# Patient Record
Sex: Female | Born: 1950 | Race: White | Hispanic: No | Marital: Married | Smoking: Former smoker
Health system: Southern US, Community
[De-identification: ages and names within clinical notes are randomized; demographics above are authoritative.]

## PROBLEM LIST (undated history)

## (undated) DIAGNOSIS — C50919 Malignant neoplasm of unspecified site of unspecified female breast: Secondary | ICD-10-CM

## (undated) DIAGNOSIS — Z853 Personal history of malignant neoplasm of breast: Secondary | ICD-10-CM

## (undated) DIAGNOSIS — K760 Fatty (change of) liver, not elsewhere classified: Secondary | ICD-10-CM

## (undated) DIAGNOSIS — Z923 Personal history of irradiation: Secondary | ICD-10-CM

## (undated) DIAGNOSIS — C50419 Malignant neoplasm of upper-outer quadrant of unspecified female breast: Secondary | ICD-10-CM

## (undated) DIAGNOSIS — C50911 Malignant neoplasm of unspecified site of right female breast: Secondary | ICD-10-CM

## (undated) DIAGNOSIS — C801 Malignant (primary) neoplasm, unspecified: Secondary | ICD-10-CM

## (undated) DIAGNOSIS — R920 Mammographic microcalcification found on diagnostic imaging of breast: Secondary | ICD-10-CM

## (undated) DIAGNOSIS — I700 Atherosclerosis of aorta: Secondary | ICD-10-CM

## (undated) DIAGNOSIS — Z9221 Personal history of antineoplastic chemotherapy: Secondary | ICD-10-CM

## (undated) DIAGNOSIS — Z171 Estrogen receptor negative status [ER-]: Secondary | ICD-10-CM

## (undated) DIAGNOSIS — Z87891 Personal history of nicotine dependence: Secondary | ICD-10-CM

## (undated) DIAGNOSIS — B029 Zoster without complications: Secondary | ICD-10-CM

## (undated) DIAGNOSIS — Z1211 Encounter for screening for malignant neoplasm of colon: Secondary | ICD-10-CM

## (undated) DIAGNOSIS — G039 Meningitis, unspecified: Secondary | ICD-10-CM

## (undated) HISTORY — DX: Personal history of nicotine dependence: Z87.891

## (undated) HISTORY — PX: COLONOSCOPY: SHX174

## (undated) HISTORY — DX: Encounter for screening for malignant neoplasm of colon: Z12.11

## (undated) HISTORY — PX: TUBAL LIGATION: SHX77

## (undated) HISTORY — DX: Meningitis, unspecified: G03.9

## (undated) HISTORY — DX: Malignant (primary) neoplasm, unspecified: C80.1

## (undated) HISTORY — DX: Atherosclerosis of aorta: I70.0

## (undated) HISTORY — DX: Malignant neoplasm of upper-outer quadrant of unspecified female breast: C50.419

## (undated) HISTORY — DX: Estrogen receptor negative status (ER-): Z17.1

## (undated) HISTORY — DX: Malignant neoplasm of unspecified site of right female breast: C50.911

## (undated) HISTORY — DX: Zoster without complications: B02.9

## (undated) HISTORY — DX: Mammographic microcalcification found on diagnostic imaging of breast: R92.0

## (undated) HISTORY — DX: Fatty (change of) liver, not elsewhere classified: K76.0

## (undated) HISTORY — DX: Personal history of malignant neoplasm of breast: Z85.3

---
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
## 1993-03-06 HISTORY — PX: ABDOMINAL HYSTERECTOMY: SHX81

## 2004-04-21 ENCOUNTER — Ambulatory Visit: Payer: Self-pay | Admitting: Family Medicine

## 2004-06-11 ENCOUNTER — Emergency Department: Payer: Self-pay | Admitting: Emergency Medicine

## 2005-05-11 ENCOUNTER — Ambulatory Visit: Payer: Self-pay | Admitting: Family Medicine

## 2006-05-15 ENCOUNTER — Ambulatory Visit: Payer: Self-pay | Admitting: Family Medicine

## 2007-09-03 ENCOUNTER — Ambulatory Visit: Payer: Self-pay | Admitting: Family Medicine

## 2007-10-18 ENCOUNTER — Encounter: Payer: Self-pay | Admitting: Pulmonary Disease

## 2007-10-18 ENCOUNTER — Ambulatory Visit: Payer: Self-pay | Admitting: Family Medicine

## 2007-10-31 ENCOUNTER — Ambulatory Visit: Payer: Self-pay | Admitting: Family Medicine

## 2007-10-31 ENCOUNTER — Encounter: Payer: Self-pay | Admitting: Pulmonary Disease

## 2007-11-08 ENCOUNTER — Encounter: Payer: Self-pay | Admitting: Pulmonary Disease

## 2007-11-08 ENCOUNTER — Ambulatory Visit: Payer: Self-pay | Admitting: Family Medicine

## 2008-01-15 ENCOUNTER — Ambulatory Visit: Payer: Self-pay | Admitting: Family Medicine

## 2008-01-15 ENCOUNTER — Encounter: Payer: Self-pay | Admitting: Pulmonary Disease

## 2008-02-10 ENCOUNTER — Ambulatory Visit: Payer: Self-pay | Admitting: Pulmonary Disease

## 2008-02-10 DIAGNOSIS — J309 Allergic rhinitis, unspecified: Secondary | ICD-10-CM

## 2008-02-10 DIAGNOSIS — E785 Hyperlipidemia, unspecified: Secondary | ICD-10-CM | POA: Insufficient documentation

## 2008-02-10 DIAGNOSIS — R930 Abnormal findings on diagnostic imaging of skull and head, not elsewhere classified: Secondary | ICD-10-CM | POA: Insufficient documentation

## 2008-02-11 ENCOUNTER — Telehealth (INDEPENDENT_AMBULATORY_CARE_PROVIDER_SITE_OTHER): Payer: Self-pay | Admitting: *Deleted

## 2008-02-12 ENCOUNTER — Ambulatory Visit: Payer: Self-pay | Admitting: Pulmonary Disease

## 2008-02-12 DIAGNOSIS — J479 Bronchiectasis, uncomplicated: Secondary | ICD-10-CM

## 2008-02-12 DIAGNOSIS — J984 Other disorders of lung: Secondary | ICD-10-CM

## 2008-03-06 DIAGNOSIS — C801 Malignant (primary) neoplasm, unspecified: Secondary | ICD-10-CM

## 2008-03-06 DIAGNOSIS — C50919 Malignant neoplasm of unspecified site of unspecified female breast: Secondary | ICD-10-CM

## 2008-03-06 DIAGNOSIS — Z9221 Personal history of antineoplastic chemotherapy: Secondary | ICD-10-CM

## 2008-03-06 DIAGNOSIS — Z923 Personal history of irradiation: Secondary | ICD-10-CM

## 2008-03-06 HISTORY — PX: BREAST EXCISIONAL BIOPSY: SUR124

## 2008-03-06 HISTORY — DX: Personal history of antineoplastic chemotherapy: Z92.21

## 2008-03-06 HISTORY — DX: Malignant neoplasm of unspecified site of unspecified female breast: C50.919

## 2008-03-06 HISTORY — DX: Malignant (primary) neoplasm, unspecified: C80.1

## 2008-03-06 HISTORY — DX: Personal history of irradiation: Z92.3

## 2008-03-06 HISTORY — PX: BREAST LUMPECTOMY: SHX2

## 2008-03-06 HISTORY — PX: PORTACATH PLACEMENT: SHX2246

## 2009-01-12 ENCOUNTER — Ambulatory Visit: Payer: Self-pay | Admitting: Family Medicine

## 2009-01-14 ENCOUNTER — Ambulatory Visit: Payer: Self-pay | Admitting: Physician Assistant

## 2009-01-29 ENCOUNTER — Ambulatory Visit: Payer: Self-pay | Admitting: General Surgery

## 2009-02-02 ENCOUNTER — Ambulatory Visit: Payer: Self-pay | Admitting: General Surgery

## 2009-02-03 ENCOUNTER — Ambulatory Visit: Payer: Self-pay | Admitting: Oncology

## 2009-02-19 ENCOUNTER — Ambulatory Visit: Payer: Self-pay | Admitting: Oncology

## 2009-03-03 ENCOUNTER — Ambulatory Visit: Payer: Self-pay | Admitting: General Surgery

## 2009-03-06 ENCOUNTER — Ambulatory Visit: Payer: Self-pay | Admitting: Oncology

## 2009-04-06 ENCOUNTER — Ambulatory Visit: Payer: Self-pay | Admitting: Oncology

## 2009-05-04 ENCOUNTER — Ambulatory Visit: Payer: Self-pay | Admitting: Oncology

## 2009-06-04 ENCOUNTER — Ambulatory Visit: Payer: Self-pay | Admitting: Oncology

## 2009-07-04 ENCOUNTER — Ambulatory Visit: Payer: Self-pay | Admitting: Oncology

## 2009-08-04 ENCOUNTER — Ambulatory Visit: Payer: Self-pay | Admitting: Oncology

## 2009-09-03 ENCOUNTER — Ambulatory Visit: Payer: Self-pay | Admitting: Oncology

## 2009-10-04 ENCOUNTER — Ambulatory Visit: Payer: Self-pay | Admitting: Oncology

## 2009-10-29 LAB — CANCER ANTIGEN 27.29: CA 27.29: 11 U/mL (ref 0.0–38.6)

## 2009-11-04 ENCOUNTER — Ambulatory Visit: Payer: Self-pay | Admitting: Oncology

## 2009-12-09 ENCOUNTER — Ambulatory Visit: Payer: Self-pay | Admitting: Oncology

## 2010-01-04 ENCOUNTER — Ambulatory Visit: Payer: Self-pay | Admitting: Oncology

## 2010-01-17 ENCOUNTER — Ambulatory Visit: Payer: Self-pay | Admitting: General Surgery

## 2010-02-03 ENCOUNTER — Ambulatory Visit: Payer: Self-pay | Admitting: Oncology

## 2010-02-21 ENCOUNTER — Ambulatory Visit: Payer: Self-pay | Admitting: Unknown Physician Specialty

## 2010-03-14 ENCOUNTER — Ambulatory Visit: Payer: Self-pay | Admitting: Oncology

## 2010-03-15 LAB — CANCER ANTIGEN 27.29: CA 27.29: 19.2 U/mL (ref 0.0–38.6)

## 2010-04-06 ENCOUNTER — Ambulatory Visit: Payer: Self-pay | Admitting: Oncology

## 2010-04-06 HISTORY — PX: PORT-A-CATH REMOVAL: SHX5289

## 2010-05-05 ENCOUNTER — Ambulatory Visit: Payer: Self-pay | Admitting: Oncology

## 2010-08-23 ENCOUNTER — Ambulatory Visit: Payer: Self-pay | Admitting: General Surgery

## 2010-09-22 ENCOUNTER — Ambulatory Visit: Payer: Self-pay | Admitting: Oncology

## 2010-10-05 ENCOUNTER — Ambulatory Visit: Payer: Self-pay | Admitting: Oncology

## 2010-11-05 ENCOUNTER — Ambulatory Visit: Payer: Self-pay | Admitting: Oncology

## 2011-02-23 ENCOUNTER — Ambulatory Visit: Payer: Self-pay | Admitting: General Surgery

## 2011-03-07 DIAGNOSIS — R920 Mammographic microcalcification found on diagnostic imaging of breast: Secondary | ICD-10-CM

## 2011-03-07 HISTORY — DX: Mammographic microcalcification found on diagnostic imaging of breast: R92.0

## 2011-03-27 ENCOUNTER — Ambulatory Visit: Payer: Self-pay | Admitting: Oncology

## 2011-03-27 LAB — COMPREHENSIVE METABOLIC PANEL
Alkaline Phosphatase: 103 U/L (ref 50–136)
Anion Gap: 9 (ref 7–16)
Bilirubin,Total: 0.4 mg/dL (ref 0.2–1.0)
Calcium, Total: 9.4 mg/dL (ref 8.5–10.1)
Chloride: 101 mmol/L (ref 98–107)
Co2: 30 mmol/L (ref 21–32)
EGFR (African American): >60
EGFR (Non-African Amer.): >60
Glucose: 100 mg/dL — ABNORMAL HIGH (ref 65–99)
Potassium: 4.1 mmol/L (ref 3.5–5.1)
SGOT(AST): 29 U/L (ref 15–37)
SGPT (ALT): 28 U/L

## 2011-03-27 LAB — CBC CANCER CENTER
Basophil #: 0 x10 3/mm (ref 0.0–0.1)
Basophil %: 0.4 %
Eosinophil #: 0.1 x10 3/mm (ref 0.0–0.7)
HGB: 14.9 g/dL (ref 12.0–16.0)
Lymphocyte %: 25.2 %
MCH: 31.6 pg (ref 26.0–34.0)
MCHC: 34.1 g/dL (ref 32.0–36.0)
MCV: 93 fL (ref 80–100)
Monocyte #: 0.4 x10 3/mm (ref 0.0–0.7)
Neutrophil #: 2.7 x10 3/mm (ref 1.4–6.5)
Neutrophil %: 63.2 %
Platelet: 239 x10 3/mm (ref 150–440)
RBC: 4.71 10*6/uL (ref 3.80–5.20)
RDW: 13.6 % (ref 11.5–14.5)

## 2011-03-27 LAB — LIPID PANEL
Cholesterol: 272 mg/dL — ABNORMAL HIGH (ref 0–200)
HDL Cholesterol: 80 mg/dL — ABNORMAL HIGH (ref 40–60)

## 2011-03-28 LAB — CANCER ANTIGEN 27.29: CA 27.29: 23.4 U/mL (ref 0.0–38.6)

## 2011-04-07 ENCOUNTER — Ambulatory Visit: Payer: Self-pay | Admitting: Oncology

## 2011-09-28 ENCOUNTER — Ambulatory Visit: Payer: Self-pay | Admitting: Oncology

## 2011-09-28 LAB — CBC CANCER CENTER
Basophil %: 0.6 %
Eosinophil #: 0.1 x10 3/mm (ref 0.0–0.7)
Eosinophil %: 1.9 %
HGB: 15.3 g/dL (ref 12.0–16.0)
MCH: 31.8 pg (ref 26.0–34.0)
MCHC: 33.6 g/dL (ref 32.0–36.0)
MCV: 95 fL (ref 80–100)
Monocyte #: 0.4 x10 3/mm (ref 0.2–0.9)
Neutrophil #: 2.1 x10 3/mm (ref 1.4–6.5)
Neutrophil %: 54.1 %
Platelet: 254 x10 3/mm (ref 150–440)
RBC: 4.8 10*6/uL (ref 3.80–5.20)
RDW: 13.2 % (ref 11.5–14.5)
WBC: 3.9 x10 3/mm (ref 3.6–11.0)

## 2011-09-28 LAB — LIPID PANEL
Cholesterol: 281 mg/dL — ABNORMAL HIGH (ref 0–200)
HDL Cholesterol: 81 mg/dL — ABNORMAL HIGH (ref 40–60)
Ldl Cholesterol, Calc: 170 mg/dL — ABNORMAL HIGH (ref 0–100)
Triglycerides: 149 mg/dL (ref 0–200)
VLDL Cholesterol, Calc: 30 mg/dL (ref 5–40)

## 2011-09-28 LAB — TSH: Thyroid Stimulating Horm: 6.53 u[IU]/mL — ABNORMAL HIGH

## 2011-09-28 LAB — COMPREHENSIVE METABOLIC PANEL
Albumin: 4.1 g/dL (ref 3.4–5.0)
Alkaline Phosphatase: 83 U/L (ref 50–136)
BUN: 13 mg/dL (ref 7–18)
Bilirubin,Total: 0.4 mg/dL (ref 0.2–1.0)
Co2: 29 mmol/L (ref 21–32)
Creatinine: 0.87 mg/dL (ref 0.60–1.30)
EGFR (Non-African Amer.): >60
Glucose: 102 mg/dL — ABNORMAL HIGH (ref 65–99)
SGOT(AST): 22 U/L (ref 15–37)
SGPT (ALT): 31 U/L

## 2011-09-29 LAB — CANCER ANTIGEN 27.29: CA 27.29: 15.7 U/mL (ref 0.0–38.6)

## 2011-10-05 ENCOUNTER — Ambulatory Visit: Payer: Self-pay | Admitting: Oncology

## 2012-02-26 ENCOUNTER — Ambulatory Visit: Payer: Self-pay | Admitting: General Surgery

## 2012-02-29 ENCOUNTER — Ambulatory Visit: Payer: Self-pay | Admitting: General Surgery

## 2012-03-06 ENCOUNTER — Ambulatory Visit: Payer: Self-pay | Admitting: Oncology

## 2012-03-06 DIAGNOSIS — B029 Zoster without complications: Secondary | ICD-10-CM

## 2012-03-06 HISTORY — PX: BREAST BIOPSY: SHX20

## 2012-03-06 HISTORY — DX: Zoster without complications: B02.9

## 2012-03-11 ENCOUNTER — Ambulatory Visit: Payer: Self-pay | Admitting: General Surgery

## 2012-03-28 LAB — COMPREHENSIVE METABOLIC PANEL
Albumin: 4 g/dL (ref 3.4–5.0)
Alkaline Phosphatase: 86 U/L (ref 50–136)
Anion Gap: 8 (ref 7–16)
BUN: 14 mg/dL (ref 7–18)
Bilirubin,Total: 0.4 mg/dL (ref 0.2–1.0)
Co2: 30 mmol/L (ref 21–32)
Creatinine: 0.81 mg/dL (ref 0.60–1.30)
EGFR (African American): >60
EGFR (Non-African Amer.): >60
SGOT(AST): 25 U/L (ref 15–37)
Sodium: 137 mmol/L (ref 136–145)

## 2012-03-28 LAB — CBC CANCER CENTER
Basophil #: 0 x10 3/mm (ref 0.0–0.1)
HCT: 46.1 % (ref 35.0–47.0)
Lymphocyte #: 1.2 x10 3/mm (ref 1.0–3.6)
Lymphocyte %: 22.1 %
MCHC: 33.8 g/dL (ref 32.0–36.0)
MCV: 92 fL (ref 80–100)
Monocyte #: 0.4 x10 3/mm (ref 0.2–0.9)
Neutrophil #: 3.6 x10 3/mm (ref 1.4–6.5)
Neutrophil %: 67.9 %
Platelet: 214 x10 3/mm (ref 150–440)
RBC: 5.01 10*6/uL (ref 3.80–5.20)
WBC: 5.2 x10 3/mm (ref 3.6–11.0)

## 2012-03-29 LAB — CANCER ANTIGEN 27.29: CA 27.29: 22.5 U/mL (ref 0.0–38.6)

## 2012-04-06 ENCOUNTER — Ambulatory Visit: Payer: Self-pay | Admitting: Oncology

## 2012-05-04 ENCOUNTER — Ambulatory Visit: Payer: Self-pay | Admitting: Oncology

## 2012-08-04 ENCOUNTER — Ambulatory Visit: Payer: Self-pay | Admitting: Internal Medicine

## 2012-09-02 ENCOUNTER — Encounter: Payer: Self-pay | Admitting: *Deleted

## 2012-09-02 DIAGNOSIS — C801 Malignant (primary) neoplasm, unspecified: Secondary | ICD-10-CM | POA: Insufficient documentation

## 2012-09-09 ENCOUNTER — Other Ambulatory Visit: Payer: Self-pay

## 2012-09-09 ENCOUNTER — Encounter: Payer: Self-pay | Admitting: General Surgery

## 2012-09-09 ENCOUNTER — Ambulatory Visit (INDEPENDENT_AMBULATORY_CARE_PROVIDER_SITE_OTHER): Payer: Managed Care, Other (non HMO) | Admitting: General Surgery

## 2012-09-09 VITALS — BP 130/72 | HR 74 | Resp 12 | Ht 64.0 in | Wt 153.0 lb

## 2012-09-09 DIAGNOSIS — Z853 Personal history of malignant neoplasm of breast: Secondary | ICD-10-CM

## 2012-09-09 DIAGNOSIS — N644 Mastodynia: Secondary | ICD-10-CM

## 2012-09-09 NOTE — Progress Notes (Signed)
Patient ID: Anna Randolph, female   DOB: Dec 16, 1950, 62 y.o.   MRN: [id number]  Chief Complaint  Patient presents with  . Breast Problem    right breast pain for several weeks - 7 month post op stereo biopsy    HPI Anna Randolph is a 62 y.o. female who presents for right breast pain for several weeks. The patient is 7 months post op right breast stereo biopsy. She also states the right breast heaviness and thickening for approximately 4 weeks. The pain is in several different places and not confine to one location.   HPI  Past Medical History  Diagnosis Date  . Personal history of tobacco use, presenting hazards to health   . Personal history of malignant neoplasm of breast   . Mammographic microcalcification 2013  . Special screening for malignant neoplasms, colon   . 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 Malignant neoplasm of upper-outer quadrant of female breast   . Meningitis as infant  . Shingles 2014    Past Surgical History  Procedure Laterality Date  . Colonoscopy  4098,1191    Dr. Mechele Collin  . Portacath placement  2010    placement  . Breast lumpectomy Right 2010    sentinel node bx  . Abdominal hysterectomy  1995  . Tonsillectomy  1960  . Tubal ligation    . Port-a-cath removal  04/2010  . Breast biopsy Right Jan 2014    stereo    Family History  Problem Relation Age of Onset  . Leukemia Brother     Social History History  Substance Use Topics  . Smoking status: Former Smoker -- 18 years  . Smokeless tobacco: Not on file     Comment: quit 1990  . Alcohol Use: 0.0 oz/week    5-6 Glasses of wine per week    Allergies  Allergen Reactions  . Sulfa Antibiotics Anaphylaxis    Throat Swells  . Sulfonamide Derivatives Anaphylaxis    Throat Swells  . Tape Rash    Adhesive bandages    Current Outpatient Prescriptions  Medication Sig Dispense Refill  . aspirin 81 MG tablet Take 81 mg by mouth daily.      .  calcium-vitamin D (OSCAL WITH D) 500-200 MG-UNIT per tablet Take 1 tablet by mouth.      . Coenzyme Q10 (CO Q 10) 10 MG CAPS Take 1 tablet by mouth daily.      Marland Kitchen lactobacillus acidophilus (BACID) TABS Take 2 tablets by mouth 3 (three) times daily.      . Misc Natural Products (DAILY HERBS ENERGY PO) Take 1 capsule by mouth.      Gerarda Fraction Root EXTR Take 1 tablet by mouth daily.       No current facility-administered medications for this visit.    Review of Systems Review of Systems  Constitutional: Negative.   Respiratory: Negative.   Cardiovascular: Negative.     Blood pressure 130/72, pulse 74, resp. rate 12, height 5\' 4"  (1.626 m), weight 153 lb (69.4 kg).  Physical Exam Physical Exam  Constitutional: She is oriented to person, place, and time. She appears well-developed and well-nourished.  Pulmonary/Chest:  Right breast will ill define firm area 12 o'clock above the nipple. This is the site of her tenderness. No palpable axillary nodes.  Neurological: She is alert and oriented to person, place, and time.  Skin: Skin is warm and dry.    Data Reviewed None  Assessment  Probable benign changes of radiation. US done today showed no findings in the area of thickening.    Plan    Patient to call if no better in 1 month.         SANKAR,SEEPLAPUTHUR G 09/10/2012, 7:11 AM

## 2012-09-09 NOTE — Patient Instructions (Addendum)
Patient to call if no better in 1 month. Otherwise follow up as scheduled.

## 2012-09-10 ENCOUNTER — Encounter: Payer: Self-pay | Admitting: General Surgery

## 2012-09-24 ENCOUNTER — Ambulatory Visit: Payer: Self-pay | Admitting: Oncology

## 2012-09-26 LAB — COMPREHENSIVE METABOLIC PANEL
Alkaline Phosphatase: 104 U/L (ref 50–136)
Co2: 30 mmol/L (ref 21–32)
EGFR (African American): >60
EGFR (Non-African Amer.): >60
Osmolality: 277 (ref 275–301)
Potassium: 4.3 mmol/L (ref 3.5–5.1)
Sodium: 139 mmol/L (ref 136–145)
Total Protein: 7.7 g/dL (ref 6.4–8.2)

## 2012-09-26 LAB — CBC CANCER CENTER
HGB: 15.4 g/dL (ref 12.0–16.0)
Lymphocyte #: 1.1 x10 3/mm (ref 1.0–3.6)
Monocyte %: 9.8 %
WBC: 4.7 x10 3/mm (ref 3.6–11.0)

## 2012-09-27 LAB — CANCER ANTIGEN 27.29: CA 27.29: 15.9 U/mL (ref 0.0–38.6)

## 2012-10-04 ENCOUNTER — Ambulatory Visit: Payer: Self-pay | Admitting: Oncology

## 2012-12-10 ENCOUNTER — Ambulatory Visit (INDEPENDENT_AMBULATORY_CARE_PROVIDER_SITE_OTHER): Payer: Managed Care, Other (non HMO) | Admitting: Podiatry

## 2012-12-10 ENCOUNTER — Ambulatory Visit (INDEPENDENT_AMBULATORY_CARE_PROVIDER_SITE_OTHER): Payer: Managed Care, Other (non HMO)

## 2012-12-10 ENCOUNTER — Encounter: Payer: Self-pay | Admitting: Podiatry

## 2012-12-10 VITALS — BP 131/85 | HR 60 | Temp 98.0°F | Resp 16 | Ht 64.0 in | Wt 156.2 lb

## 2012-12-10 DIAGNOSIS — R52 Pain, unspecified: Secondary | ICD-10-CM

## 2012-12-10 DIAGNOSIS — R609 Edema, unspecified: Secondary | ICD-10-CM

## 2012-12-10 DIAGNOSIS — S93609A Unspecified sprain of unspecified foot, initial encounter: Secondary | ICD-10-CM

## 2012-12-10 DIAGNOSIS — M779 Enthesopathy, unspecified: Secondary | ICD-10-CM

## 2012-12-10 NOTE — Progress Notes (Signed)
N tender   L left 5th toe  D 3 weeks  O all of a sudden stubbed it C better A shoes  T icing   N puffy   L right foot lateral  D 3 weeks  O after twisting foot  C better A no pain just puffines  T no treatment

## 2012-12-10 NOTE — Progress Notes (Signed)
Subjective:     Patient ID: Anna Randolph, female   DOB: [birth year], 62 y.o.   MRN: [id number]  Toe Pain    patient states that she traumatized her left fifth toe 3 weeks ago. States she traumatized her right foot and ankle approximately 2 months ago with continued swelling. states her left fifth toe is continuing to aggravate her and is not allowing her to wear shoe gear comfortably.   Review of Systems  All other systems reviewed and are negative.       Objective:   Physical Exam  Nursing note and vitals reviewed. Cardiovascular: Intact distal pulses.    neurological found to be intact bilateral. Muscle strength found to be within normal limits of all muscle groups. Range of motion subtalar and midtarsal joint adequate bilateral. I noted edema in the left fifth toe with moderate pain upon pressure. And mild to moderate edema in the right ankle with no pain to pressure.    Assessment:     Possible fracture of left fifth toe. Trauma to the right ankle with possible bone injury. Tendinitis present.    Plan:     Initial H&P performed. X-rays reviewed. Advised patient on length of time to heal sprained foot and trauma. If no improvement in the next 5-6 weeks I've advised her to return for minimal cortisone injection. Should heal uneventfully.

## 2012-12-10 NOTE — Patient Instructions (Signed)
Call if not improved in 5-6 weeks

## 2012-12-12 ENCOUNTER — Encounter: Payer: Self-pay | Admitting: Family Medicine

## 2012-12-12 ENCOUNTER — Ambulatory Visit (INDEPENDENT_AMBULATORY_CARE_PROVIDER_SITE_OTHER): Payer: 59 | Admitting: Family Medicine

## 2012-12-12 VITALS — BP 140/70 | HR 85 | Temp 98.7°F | Ht 64.0 in | Wt 155.0 lb

## 2012-12-12 DIAGNOSIS — E785 Hyperlipidemia, unspecified: Secondary | ICD-10-CM

## 2012-12-12 DIAGNOSIS — Z23 Encounter for immunization: Secondary | ICD-10-CM

## 2012-12-12 DIAGNOSIS — Z853 Personal history of malignant neoplasm of breast: Secondary | ICD-10-CM

## 2012-12-12 DIAGNOSIS — M549 Dorsalgia, unspecified: Secondary | ICD-10-CM | POA: Insufficient documentation

## 2012-12-12 LAB — COMPREHENSIVE METABOLIC PANEL
ALT: 19 U/L (ref 0–35)
CO2: 28 mEq/L (ref 19–32)
Calcium: 10 mg/dL (ref 8.4–10.5)
Chloride: 101 mEq/L (ref 96–112)
Creatinine, Ser: 0.8 mg/dL (ref 0.4–1.2)
GFR: 83.02 mL/min (ref >60.00)
Glucose, Bld: 111 mg/dL — ABNORMAL HIGH (ref 70–99)
Sodium: 139 mEq/L (ref 135–145)
Total Protein: 7.9 g/dL (ref 6.0–8.3)

## 2012-12-12 LAB — CBC WITH DIFFERENTIAL/PLATELET
Basophils Relative: 0.4 % (ref 0.0–3.0)
Eosinophils Relative: 0.6 % (ref 0.0–5.0)
HCT: 44.8 % (ref 36.0–46.0)
Hemoglobin: 15.3 g/dL — ABNORMAL HIGH (ref 12.0–15.0)
Lymphs Abs: 1.6 10*3/uL (ref 0.7–4.0)
MCV: 91.2 fl (ref 78.0–100.0)
Monocytes Absolute: 0.6 10*3/uL (ref 0.1–1.0)
Monocytes Relative: 7.4 % (ref 3.0–12.0)
Platelets: 261 10*3/uL (ref 150.0–400.0)
RBC: 4.92 Mil/uL (ref 3.87–5.11)
WBC: 8.4 10*3/uL (ref 4.5–10.5)

## 2012-12-12 LAB — MAGNESIUM: Magnesium: 2.1 mg/dL (ref 1.5–2.5)

## 2012-12-12 LAB — LIPID PANEL: Cholesterol: 333 mg/dL — ABNORMAL HIGH (ref 0–200)

## 2012-12-12 LAB — LDL CHOLESTEROL, DIRECT: Direct LDL: 219.6 mg/dL

## 2012-12-12 MED ORDER — DEXAMETHASONE SODIUM PHOSPHATE 10 MG/ML IJ SOLN
10.0000 mg | Freq: Once | INTRAMUSCULAR | Status: AC
  Administered 2012-12-12: 10 mg via INTRAVENOUS

## 2012-12-12 MED ORDER — OXYCODONE-ACETAMINOPHEN 5-325 MG PO TABS: 1.0000 | ORAL_TABLET | Freq: Three times a day (TID) | ORAL | Status: DC | PRN

## 2012-12-12 MED ORDER — PREDNISONE (PAK) 10 MG PO TABS: 10.0000 mg | ORAL_TABLET | Freq: Every day | ORAL | Status: DC

## 2012-12-12 NOTE — Patient Instructions (Addendum)
It was wonderful to meet you. Please start taking prednisone in 3 days if your pain has not improved. Ok to try heat.  Ok to take Percocet as needed- it can make you sleepy. Please do not take Ibuprofen, alleve or advil in next 3 days.  Ok to take Tylenol. We will call yo with your lab results. Please come back at your convenience for a physical exam.  Sacroiliac Joint Dysfunction The sacroiliac joint connects the lower part of the spine (the sacrum) with the bones of the pelvis. CAUSES  Sometimes, there is no obvious reason for sacroiliac joint dysfunction. Other times, it may occur   During pregnancy.  After injury, such as:  Car accidents.  Sport-related injuries.  Work-related injuries.  Due to one leg being shorter than the other.  Due to other conditions that affect the joints, such as:  Rheumatoid arthritis.  Gout.  Psoriasis.  Joint infection (septic arthritis). SYMPTOMS  Symptoms may include:  Pain in the:  Lower back.  Buttocks.  Groin.  Thighs and legs.  Difficult sitting, standing, walking, lying, bending or lifting. DIAGNOSIS  A number of tests may be used to help diagnose the cause of sacroiliac joint dysfunction, including:  Imaging tests to look for other causes of pain, including:  MRI.  CT scan.  Bone scan.  Diagnostic injection: During a special x-ray (called fluoroscopy), a needle is put into the sacroiliac joint. A numbing medicine is injected into the joint. If the pain is improved or stopped, the diagnosis of sacroiliac joint dysfunction is more likely. TREATMENT  There are a number of types of treatment used for sacroiliac joint dysfunction, including:  Only take over-the-counter or prescription medicines for pain, discomfort, or fever as directed by your caregiver.  Medications to relax muscles.  Rest. Decreasing activity can help cut down on painful muscle spasms and allow the back to heal.  Application of heat or ice to  the lower back may improve muscle spasms and soothe pain.  Brace. A special back brace, called a sacroiliac belt, can help support the joint while your back is healing.  Physical therapy can help teach comfortable positions and exercises to strengthen muscles that support the sacroiliac joint.  Cortisone injections. Injections of steroid medicine into the joint can help decrease swelling and improve pain.  Hyaluronic acid injections. This chemical improves lubrication within the sacroiliac joint, thereby decreasing pain.  Radiofrequency ablation. A special needle is placed into the joint, where it burns away nerves that are carrying pain messages from the joint.  Surgery. Because pain occurs during movement of the joint, screws and plates may be installed in order to limit or prevent joint motion. HOME CARE INSTRUCTIONS   Take all medications exactly as directed.  Follow instructions regarding both rest and physical activity, to avoid worsening the pain.  Do physical therapy exercises exactly as prescribed. SEEK IMMEDIATE MEDICAL CARE IF:  You experience increasingly severe pain.  You develop new symptoms, such as numbness or tingling in your legs or feet.  You lose bladder or bowel control. Document Released: 05/19/2008 Document Revised: 05/15/2011 Document Reviewed: 05/19/2008 [facility] Patient Information 2014 [place], [place].

## 2012-12-12 NOTE — Progress Notes (Signed)
Subjective:    Patient ID: Anna Randolph, female    DOB: [birth year], 62 y.o.   MRN: [id number]  HPI  Very pleasant 62 yo female originally here to establish care and CPX here for acute onset of "sciatica."  Restarted her exercise routine yesterday after taking a few weeks off due a toe injury.  Did not do anything "too strenuous." She has a cardio/weights routine at the gym.  This am, woke up with severe right buttocks pain that radiates down the back of her thigh, sometimes front of her thigh.  Worse with sitting- feels better when she stands. No numbness or LE weakness.  No trauma.  No urinary or bowel incontinence.  No foot drop.  She is a breast CA survivor- s/p lumpectomy, chemo and radiation (2010)- followed by Dr. Evette Cristal and [facility].  UTD on mammograms.  Patient Active Problem List   Diagnosis Date Noted  . Back pain with radiation 12/12/2012  . Personal history of breast cancer 09/09/2012  . Cancer   . BRONCHIECTASIS WITHOUT ACUTE EXACERBATION 02/12/2008  . PULMONARY NODULE, LEFT LOWER LOBE 02/12/2008  . HYPERLIPIDEMIA 02/10/2008  . ALLERGIC RHINITIS 02/10/2008  . Nonspecific (abnormal) findings on radiological and other examination of body structure 02/10/2008  . CT, CHEST, ABNORMAL 02/10/2008   Past Medical History  Diagnosis Date  . Personal history of tobacco use, presenting hazards to health   . Personal history of malignant neoplasm of breast   . Mammographic microcalcification 2013  . Special screening for malignant neoplasms, colon   . 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 Malignant neoplasm of upper-outer quadrant of female breast   . Meningitis as infant  . Shingles 2014   Past Surgical History  Procedure Laterality Date  . Colonoscopy  4098,1191    Dr. Mechele Collin  . Portacath placement  2010    placement  . Breast lumpectomy Right 2010    sentinel node bx  . Abdominal hysterectomy  1995  . Tonsillectomy  1960   . Tubal ligation    . Port-a-cath removal  04/2010  . Breast biopsy Right Jan 2014    stereo   History  Substance Use Topics  . Smoking status: Former Smoker -- 18 years  . Smokeless tobacco: Never Used     Comment: quit 1990  . Alcohol Use: 0.0 oz/week    5-6 Glasses of wine per week   Family History  Problem Relation Age of Onset  . Leukemia Brother    Allergies  Allergen Reactions  . Sulfa Antibiotics Anaphylaxis    Throat Swells  . Sulfonamide Derivatives Anaphylaxis    Throat Swells  . Latex Rash  . Tape Rash    Adhesive bandages   Current Outpatient Prescriptions on File Prior to Visit  Medication Sig Dispense Refill  . aspirin 81 MG tablet Take 81 mg by mouth daily.      . Cholecalciferol (VITAMIN D3) 2000 UNITS TABS Take 1 Units by mouth.      . Coenzyme Q10 (CO Q 10) 10 MG CAPS Take 1 tablet by mouth daily.      Marland Kitchen lactobacillus acidophilus (BACID) TABS Take 2 tablets by mouth 3 (three) times daily.      . Misc Natural Products (DAILY HERBS ENERGY PO) Take 1 capsule by mouth.       No current facility-administered medications on file prior to visit.   The PMH, PSH, Social History, Family History, Medications, and allergies have  been reviewed in [facility], and have been updated if relevant.   Review of Systems See HPI    Objective:   Physical Exam BP 140/70  Pulse 85  Temp(Src) 98.7 F (37.1 C) (Oral)  Ht 5\' 4"  (1.626 m)  Wt 155 lb (70.308 kg)  BMI 26.59 kg/m2  SpO2 97% Gen:  Alert, very pleasant, she does appear uncomfortable while seated HEENT:  MMM, PEERLA MSK: NO TTP over lumbar spine, does have some pain of SI joint Neg SLR bilaterally, pos fabers bilaterally Neuro:  Normal gait, DTRs symetrical    Assessment & Plan:  1. 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 She declines xray at this time which is very reasonable. No red flag symptoms. IM decadron given in office today, may start prednisone pack in 3 days if symptoms persist  (see AVS). Also given RX for prn percocet. - dexamethasone (DECADRON) injection 10 mg; Inject 1 mL (10 mg total) into the vein once.  2. HYPERLIPIDEMIA  - Comprehensive metabolic panel - Lipid Panel  3. Personal history of breast cancer UTD screening - CBC with Differential - Magnesium  4. Need for prophylactic vaccination and inoculation against influenza  - Flu Vaccine QUAD 36+ mos IM

## 2012-12-14 ENCOUNTER — Encounter: Payer: Self-pay | Admitting: Family Medicine

## 2012-12-16 ENCOUNTER — Telehealth: Payer: Self-pay

## 2012-12-16 ENCOUNTER — Other Ambulatory Visit: Payer: Self-pay | Admitting: Family Medicine

## 2012-12-16 ENCOUNTER — Ambulatory Visit (INDEPENDENT_AMBULATORY_CARE_PROVIDER_SITE_OTHER)
Admission: RE | Admit: 2012-12-16 | Discharge: 2012-12-16 | Disposition: A | Discharge status: Home / Self Care | Payer: 59 | Source: Ambulatory Visit | Attending: Family Medicine | Admitting: Family Medicine

## 2012-12-16 ENCOUNTER — Telehealth: Payer: Self-pay | Admitting: Radiology

## 2012-12-16 ENCOUNTER — Encounter: Payer: Self-pay | Admitting: Family Medicine

## 2012-12-16 DIAGNOSIS — M5430 Sciatica, unspecified side: Secondary | ICD-10-CM

## 2012-12-16 MED ORDER — ROSUVASTATIN CALCIUM 20 MG PO TABS: 20.0000 mg | ORAL_TABLET | Freq: Every day | ORAL | Status: DC

## 2012-12-16 NOTE — Telephone Encounter (Signed)
Pt was seen 12/12/12; pt said pain comes and goes but pt would like xray discussed at OV. pain level now 5.  Pt does not want to change pain  meds as noted in mychart message; pt taking oxycodone apap q 5-6 h for pain instead of q8h. Pt request cb to come back for xray.Please advise.

## 2012-12-16 NOTE — Telephone Encounter (Signed)
I placed order for xray this morning.

## 2012-12-16 NOTE — Telephone Encounter (Signed)
Patient still in a lot of pain, came in today for lumbar films. Request advice on what to do next?

## 2012-12-16 NOTE — Telephone Encounter (Signed)
Anna Randolph's note said she didn't want to change medications.  Let's call her once we have xray results and discuss.

## 2012-12-16 NOTE — Telephone Encounter (Signed)
Patient notified and will come to the office this morning for the x-ray.

## 2012-12-18 ENCOUNTER — Other Ambulatory Visit: Payer: Self-pay | Admitting: Family Medicine

## 2012-12-18 DIAGNOSIS — M549 Dorsalgia, unspecified: Secondary | ICD-10-CM

## 2013-02-18 ENCOUNTER — Other Ambulatory Visit: Payer: Self-pay | Admitting: Internal Medicine

## 2013-02-18 DIAGNOSIS — Z0000 Encounter for general adult medical examination without abnormal findings: Secondary | ICD-10-CM

## 2013-02-19 ENCOUNTER — Ambulatory Visit: Payer: Self-pay | Admitting: Oncology

## 2013-02-19 LAB — CBC CANCER CENTER
Basophil #: 0 x10 3/mm (ref 0.0–0.1)
Basophil %: 0.7 %
Eosinophil #: 0.1 x10 3/mm (ref 0.0–0.7)
Eosinophil %: 2 %
HCT: 44 % (ref 35.0–47.0)
HGB: 14.5 g/dL (ref 12.0–16.0)
Lymphocyte #: 1.4 x10 3/mm (ref 1.0–3.6)
Lymphocyte %: 28 %
MCH: 30.5 pg (ref 26.0–34.0)
MCV: 93 fL (ref 80–100)
Monocyte #: 0.4 x10 3/mm (ref 0.2–0.9)
Monocyte %: 8.6 %
Neutrophil #: 3.1 x10 3/mm (ref 1.4–6.5)
Platelet: 220 x10 3/mm (ref 150–440)
RBC: 4.76 10*6/uL (ref 3.80–5.20)
WBC: 5.2 x10 3/mm (ref 3.6–11.0)

## 2013-02-19 LAB — COMPREHENSIVE METABOLIC PANEL
Albumin: 3.8 g/dL (ref 3.4–5.0)
Alkaline Phosphatase: 101 U/L
Anion Gap: 10 (ref 7–16)
BUN: 10 mg/dL (ref 7–18)
Bilirubin,Total: 0.2 mg/dL (ref 0.2–1.0)
Chloride: 103 mmol/L (ref 98–107)
Creatinine: 0.84 mg/dL (ref 0.60–1.30)
EGFR (Non-African Amer.): >60
Glucose: 93 mg/dL (ref 65–99)
SGOT(AST): 24 U/L (ref 15–37)
Sodium: 141 mmol/L (ref 136–145)

## 2013-02-20 LAB — CANCER ANTIGEN 27.29: CA 27.29: 17 U/mL (ref 0.0–38.6)

## 2013-02-25 ENCOUNTER — Other Ambulatory Visit (INDEPENDENT_AMBULATORY_CARE_PROVIDER_SITE_OTHER): Payer: 59

## 2013-02-25 DIAGNOSIS — E785 Hyperlipidemia, unspecified: Secondary | ICD-10-CM

## 2013-02-25 DIAGNOSIS — Z0000 Encounter for general adult medical examination without abnormal findings: Secondary | ICD-10-CM

## 2013-02-25 LAB — COMPREHENSIVE METABOLIC PANEL
ALT: 22 U/L (ref 0–35)
AST: 24 U/L (ref 0–37)
Albumin: 4.2 g/dL (ref 3.5–5.2)
BUN: 11 mg/dL (ref 6–23)
Calcium: 9.5 mg/dL (ref 8.4–10.5)
Chloride: 104 mEq/L (ref 96–112)
Potassium: 4.6 mEq/L (ref 3.5–5.1)
Sodium: 139 mEq/L (ref 135–145)

## 2013-02-25 LAB — LDL CHOLESTEROL, DIRECT: Direct LDL: 111 mg/dL

## 2013-02-25 LAB — LIPID PANEL
Total CHOL/HDL Ratio: 3
VLDL: 21.8 mg/dL (ref 0.0–40.0)

## 2013-02-25 LAB — CBC
Hemoglobin: 14.9 g/dL (ref 12.0–15.0)
Platelets: 235 10*3/uL (ref 150.0–400.0)
RBC: 4.86 Mil/uL (ref 3.87–5.11)
WBC: 4.7 10*3/uL (ref 4.5–10.5)

## 2013-02-25 LAB — TSH: TSH: 5.27 u[IU]/mL (ref 0.35–5.50)

## 2013-02-26 ENCOUNTER — Ambulatory Visit: Payer: Self-pay | Admitting: General Surgery

## 2013-03-03 ENCOUNTER — Encounter: Payer: Self-pay | Admitting: General Surgery

## 2013-03-04 ENCOUNTER — Ambulatory Visit (INDEPENDENT_AMBULATORY_CARE_PROVIDER_SITE_OTHER): Payer: 59 | Admitting: Internal Medicine

## 2013-03-04 ENCOUNTER — Encounter: Payer: Self-pay | Admitting: Internal Medicine

## 2013-03-04 VITALS — BP 136/80 | HR 56 | Temp 98.4°F | Ht 64.25 in | Wt 156.8 lb

## 2013-03-04 DIAGNOSIS — Z0000 Encounter for general adult medical examination without abnormal findings: Secondary | ICD-10-CM

## 2013-03-04 MED ORDER — ROSUVASTATIN CALCIUM 20 MG PO TABS: 20.0000 mg | ORAL_TABLET | Freq: Every day | ORAL | Status: DC

## 2013-03-04 NOTE — Progress Notes (Signed)
Pre-visit discussion using our clinic review tool. No additional management support is needed unless otherwise documented below in the visit note.  

## 2013-03-04 NOTE — Progress Notes (Signed)
Subjective:    Patient ID: Anna Randolph, female    DOB: August 28, 1950, 62 y.o.   MRN: [id number]  HPI  Pt presents to the clinic today for her annual exam. She has no concerns today.  Flu: 12/12/2012 Tetanus: 2006 Pneumovax: 2012 Zostavax: 2012 Pap Smear: Hysterectomy Mammogram: 02/2013 Bone Density: more year than 2 years ago Colonoscopy: 2011 Eye Doctor: 2014 Dentist: biannually  Review of Systems      Past Medical History  Diagnosis Date  . Personal history of tobacco use, presenting hazards to health   . Personal history of malignant neoplasm of breast   . Mammographic microcalcification 2013  . Special screening for malignant neoplasms, colon   . 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 Malignant neoplasm of upper-outer quadrant of female breast   . Meningitis as infant  . Shingles 2014    Current Outpatient Prescriptions  Medication Sig Dispense Refill  . aspirin 81 MG tablet Take 81 mg by mouth daily.      . Cholecalciferol (VITAMIN D3) 2000 UNITS TABS Take 1 Units by mouth.      . Coenzyme Q10 (CO Q 10) 10 MG CAPS Take 1 tablet by mouth daily.      Marland Kitchen lactobacillus acidophilus (BACID) TABS Take 1 tablet by mouth daily as needed.       . Misc Natural Products (DAILY HERBS ENERGY PO) Take 1 capsule by mouth.      . rosuvastatin (CRESTOR) 20 MG tablet Take 1 tablet (20 mg total) by mouth daily.  90 tablet  3   No current facility-administered medications for this visit.    Allergies  Allergen Reactions  . Sulfa Antibiotics Anaphylaxis    Throat Swells  . Sulfonamide Derivatives Anaphylaxis    Throat Swells  . Latex Rash  . Tape Rash    Adhesive bandages    Family History  Problem Relation Age of Onset  . Leukemia Brother     History   Social History  . Marital Status: Divorced    Spouse Name: N/A    Number of Children: N/A  . Years of Education: N/A   Occupational History  . Not on file.   Social  History Main Topics  . Smoking status: Former Smoker -- 18 years  . Smokeless tobacco: Never Used     Comment: quit 1990  . Alcohol Use: 0.0 oz/week    5-6 Glasses of wine per week  . Drug Use: No  . Sexual Activity: Not on file   Other Topics Concern  . Not on file   Social History Narrative  . No narrative on file     Constitutional: Denies fever, malaise, fatigue, headache or abrupt weight changes.  HEENT: Denies eye pain, eye redness, ear pain, ringing in the ears, wax buildup, runny nose, nasal congestion, bloody nose, or sore throat. Respiratory: Denies difficulty breathing, shortness of breath, cough or sputum production.   Cardiovascular: Denies chest pain, chest tightness, palpitations or swelling in the hands or feet.  Gastrointestinal: Denies abdominal pain, bloating, constipation, diarrhea or blood in the stool.  GU: Denies urgency, frequency, pain with urination, burning sensation, blood in urine, odor or discharge. Musculoskeletal: Denies decrease in range of motion, difficulty with gait, muscle pain or joint pain and swelling.  Skin: Denies redness, rashes, lesions or ulcercations.  Neurological: Denies dizziness, difficulty with memory, difficulty with speech or problems with balance and coordination.   No other specific complaints  in a complete review of systems (except as listed in HPI above).  Objective:   Physical Exam  BP 136/80  Pulse 56  Temp(Src) 98.4 F (36.9 C) (Oral)  Ht 5' 4.25" (1.632 m)  Wt 156 lb 12 oz (71.101 kg)  BMI 26.70 kg/m2  SpO2 96% Wt Readings from Last 3 Encounters:  03/04/13 156 lb 12 oz (71.101 kg)  12/12/12 155 lb (70.308 kg)  12/10/12 156 lb 3.2 oz (70.852 kg)    General: Appears her stated age, well developed, well nourished in NAD. Skin: Warm, dry and intact. No rashes, lesions or ulcerations noted. HEENT: Head: normal shape and size; Eyes: sclera white, no icterus, conjunctiva pink, PERRLA and EOMs intact; Ears: Tm's gray  and intact, normal light reflex; Nose: mucosa pink and moist, septum midline; Throat/Mouth: Teeth present, mucosa pink and moist, no exudate, lesions or ulcerations noted.  Neck: Normal range of motion. Neck supple, trachea midline. No massses, lumps or thyromegaly present.  Cardiovascular: Normal rate and rhythm. S1,S2 noted.  No murmur, rubs or gallops noted. No JVD or BLE edema. No carotid bruits noted. Pulmonary/Chest: Normal effort and positive vesicular breath sounds. No respiratory distress. No wheezes, rales or ronchi noted.  Abdomen: Soft and nontender. Normal bowel sounds, no bruits noted. No distention or masses noted. Liver, spleen and kidneys non palpable. Musculoskeletal: Normal range of motion. No signs of joint swelling. No difficulty with gait.  Neurological: Alert and oriented. Cranial nerves II-XII intact. Coordination normal. +DTRs bilaterally. Psychiatric: Mood and affect normal. Behavior is normal. Judgment and thought content normal.    BMET    Component Value Date/Time   NA 139 02/25/2013 0827   K 4.6 02/25/2013 0827   CL 104 02/25/2013 0827   CO2 28 02/25/2013 0827   GLUCOSE 99 02/25/2013 0827   BUN 11 02/25/2013 0827   CREATININE 0.7 02/25/2013 0827   CALCIUM 9.5 02/25/2013 0827    Lipid Panel     Component Value Date/Time   CHOL 204* 02/25/2013 0827   TRIG 109.0 02/25/2013 0827   HDL 81.40 02/25/2013 0827   CHOLHDL 3 02/25/2013 0827   VLDL 21.8 02/25/2013 0827    CBC    Component Value Date/Time   WBC 4.7 02/25/2013 0827   RBC 4.86 02/25/2013 0827   HGB 14.9 02/25/2013 0827   HCT 44.3 02/25/2013 0827   PLT 235.0 02/25/2013 0827   MCV 91.2 02/25/2013 0827   MCHC 33.7 02/25/2013 0827   RDW 13.1 02/25/2013 0827   LYMPHSABS 1.6 12/12/2012 1132   MONOABS 0.6 12/12/2012 1132   EOSABS 0.1 12/12/2012 1132   BASOSABS 0.0 12/12/2012 1132    Hgb A1C No results found for this basename: HGBA1C         Assessment & Plan:   Preventative Health  Maintenance:  Will have bone density done next year with mammogram Labs reviewed-normal Advised pt to work on diet and exercise  RTC in 1 year or sooner if needed

## 2013-03-04 NOTE — Patient Instructions (Signed)

## 2013-03-06 ENCOUNTER — Ambulatory Visit: Payer: Self-pay | Admitting: Oncology

## 2013-03-12 ENCOUNTER — Ambulatory Visit: Payer: Managed Care, Other (non HMO) | Admitting: General Surgery

## 2013-04-02 ENCOUNTER — Encounter: Payer: 59 | Admitting: Internal Medicine

## 2013-04-09 ENCOUNTER — Encounter: Payer: Self-pay | Admitting: *Deleted

## 2013-08-05 ENCOUNTER — Telehealth: Payer: Self-pay

## 2013-08-05 MED ORDER — ATORVASTATIN CALCIUM 20 MG PO TABS: 20.0000 mg | ORAL_TABLET | Freq: Every day | ORAL | Status: DC

## 2013-08-05 NOTE — Telephone Encounter (Signed)
Spoke to pt and informed her Rx has been sent to requested pharmacy 

## 2013-08-05 NOTE — Telephone Encounter (Signed)
Warren with me. eRx sent to [facility]

## 2013-08-05 NOTE — Telephone Encounter (Signed)
Pt left v/m; pt presently taking Crestor 20 mg daily; cost of Crestor to pt is almost $200.00; pt spoke to [facility] and request Crestor changed to equivalent dosage of generic lipitor to [place]; cost of generic lipitor to pt would be $18.00. Pt request cb.

## 2013-08-15 ENCOUNTER — Telehealth: Payer: Self-pay

## 2013-08-15 MED ORDER — ROSUVASTATIN CALCIUM 5 MG PO TABS: 5.0000 mg | ORAL_TABLET | Freq: Every day | ORAL | Status: DC

## 2013-08-15 NOTE — Telephone Encounter (Addendum)
Pt left note; pt cannot take Lipitor, caused pt to be sick, ? Passed out(Allison at front desk said not sure if pt passed out or almost passed out and unable to reach pt by any contact #s). even though crestor is expensive pt request 90 day written rx for crestor and request cb when ready for pick up and pt will send to mail order pharmacy with her other paperwork. Pt is out of med but does not want small quantity sent to local pharmacy due to expense. Dr Deborra Medina has left office and will not return until 08/25/13.Please advise.

## 2013-08-15 NOTE — Telephone Encounter (Signed)
Patient returned Anna Randolph call and said she "almost"passed out.  She didn't pass out.

## 2013-08-15 NOTE — Telephone Encounter (Signed)
Will send to Dr. Darnell Level as he is covering for Dr. Loni Muse while she is on vacation.

## 2013-08-15 NOTE — Telephone Encounter (Signed)
crestor 5mg  script printed and placed in my out box. Trial of this and notify us if any side effects. However, pt to be aware insurance may not cover this as there are generic alternatives to lipitor 20mg  dose (pravastatin 80, simvastatin 40).

## 2013-08-18 NOTE — Telephone Encounter (Signed)
Spoke to pt who states that she has been on 20mg  of crestor and is wanting to be on that dosage. She states that she is unable to "keep paying for prescriptions" and is wanting to wait until Dr Deborra Medina returns to discuss with her. Offered to send new request to Dr Danise Mina, but pt is not wanting to do so. Advised pt to contact us on 06/22 to have message sent directly to Dr Deborra Medina as she will RTO on that day.

## 2013-09-01 ENCOUNTER — Ambulatory Visit: Payer: Self-pay | Admitting: Oncology

## 2013-09-01 LAB — CBC CANCER CENTER
Basophil #: 0 x10 3/mm (ref 0.0–0.1)
Basophil %: 0.7 %
Eosinophil #: 0.1 x10 3/mm (ref 0.0–0.7)
Eosinophil %: 2 %
HCT: 44.3 % (ref 35.0–47.0)
HGB: 14.8 g/dL (ref 12.0–16.0)
LYMPHS ABS: 1.6 x10 3/mm (ref 1.0–3.6)
Lymphocyte %: 33.4 %
MCH: 30.9 pg (ref 26.0–34.0)
MCHC: 33.4 g/dL (ref 32.0–36.0)
MCV: 93 fL (ref 80–100)
Monocyte #: 0.5 x10 3/mm (ref 0.2–0.9)
Monocyte %: 10.4 %
NEUTROS ABS: 2.6 x10 3/mm (ref 1.4–6.5)
Neutrophil %: 53.5 %
Platelet: 251 x10 3/mm (ref 150–440)
RBC: 4.79 10*6/uL (ref 3.80–5.20)
RDW: 13.2 % (ref 11.5–14.5)
WBC: 4.8 x10 3/mm (ref 3.6–11.0)

## 2013-09-01 LAB — COMPREHENSIVE METABOLIC PANEL
ALK PHOS: 97 U/L
ALT: 28 U/L (ref 12–78)
Albumin: 3.6 g/dL (ref 3.4–5.0)
Anion Gap: 8 (ref 7–16)
BUN: 12 mg/dL (ref 7–18)
Bilirubin,Total: 0.3 mg/dL (ref 0.2–1.0)
CHLORIDE: 102 mmol/L (ref 98–107)
Calcium, Total: 9.3 mg/dL (ref 8.5–10.1)
Co2: 30 mmol/L (ref 21–32)
Creatinine: 1 mg/dL (ref 0.60–1.30)
EGFR (African American): >60
GFR CALC NON AF AMER: 60 — AB
GLUCOSE: 95 mg/dL (ref 65–99)
Osmolality: 279 (ref 275–301)
POTASSIUM: 4.1 mmol/L (ref 3.5–5.1)
SGOT(AST): 20 U/L (ref 15–37)
SODIUM: 140 mmol/L (ref 136–145)
Total Protein: 7.5 g/dL (ref 6.4–8.2)

## 2013-09-02 LAB — CANCER ANTIGEN 27.29: CA 27.29: 17.7 U/mL (ref 0.0–38.6)

## 2013-09-03 ENCOUNTER — Ambulatory Visit: Payer: Self-pay | Admitting: Oncology

## 2013-09-04 ENCOUNTER — Other Ambulatory Visit: Payer: Self-pay | Admitting: *Deleted

## 2013-09-04 MED ORDER — ROSUVASTATIN CALCIUM 5 MG PO TABS: 5.0000 mg | ORAL_TABLET | Freq: Every day | ORAL | Status: DC

## 2013-09-04 NOTE — Telephone Encounter (Signed)
Ok to print out crestor 5mg  dose #90 day supply with 3 refills.

## 2013-09-04 NOTE — Telephone Encounter (Signed)
Lm on pts vm informing her Rx is available for pickup at the front desk 

## 2013-09-04 NOTE — Telephone Encounter (Signed)
Dr Deborra Medina is out of the office on today. If you would please review this for me, and advise if ok to print Rx for pt to pick up. Thanks

## 2014-01-05 ENCOUNTER — Encounter: Payer: Self-pay | Admitting: Internal Medicine

## 2014-02-02 ENCOUNTER — Telehealth: Payer: Self-pay | Admitting: Family Medicine

## 2014-02-02 DIAGNOSIS — C801 Malignant (primary) neoplasm, unspecified: Secondary | ICD-10-CM

## 2014-02-02 NOTE — Telephone Encounter (Signed)
Order entered

## 2014-02-02 NOTE — Telephone Encounter (Signed)
Pt needs Diagnostic Mammogram. She has contacted [facility] and they have availability 03/02/2014 to schedule.  Best number to call pt is [phone]

## 2014-02-20 ENCOUNTER — Other Ambulatory Visit: Payer: Self-pay | Admitting: Family Medicine

## 2014-02-20 DIAGNOSIS — E785 Hyperlipidemia, unspecified: Secondary | ICD-10-CM

## 2014-02-20 DIAGNOSIS — Z01419 Encounter for gynecological examination (general) (routine) without abnormal findings: Secondary | ICD-10-CM | POA: Insufficient documentation

## 2014-02-20 DIAGNOSIS — C801 Malignant (primary) neoplasm, unspecified: Secondary | ICD-10-CM

## 2014-02-24 ENCOUNTER — Telehealth: Payer: Self-pay | Admitting: Family Medicine

## 2014-02-24 DIAGNOSIS — Z853 Personal history of malignant neoplasm of breast: Secondary | ICD-10-CM

## 2014-02-24 NOTE — Telephone Encounter (Signed)
Order entered

## 2014-02-24 NOTE — Telephone Encounter (Signed)
[facility] called needing order of [place] of Right Breast (code Z 85.3). Please put in asap.  Thanks

## 2014-02-25 ENCOUNTER — Other Ambulatory Visit (INDEPENDENT_AMBULATORY_CARE_PROVIDER_SITE_OTHER): Payer: 59

## 2014-02-25 DIAGNOSIS — Z01419 Encounter for gynecological examination (general) (routine) without abnormal findings: Secondary | ICD-10-CM

## 2014-02-25 DIAGNOSIS — E785 Hyperlipidemia, unspecified: Secondary | ICD-10-CM

## 2014-02-25 DIAGNOSIS — Z0000 Encounter for general adult medical examination without abnormal findings: Secondary | ICD-10-CM

## 2014-02-25 LAB — LIPID PANEL
CHOLESTEROL: 345 mg/dL — AB (ref 0–200)
HDL: 70.8 mg/dL (ref >39.00)
LDL Cholesterol: 244 mg/dL — ABNORMAL HIGH (ref 0–99)
NonHDL: 274.2
TRIGLYCERIDES: 150 mg/dL — AB (ref 0.0–149.0)
Total CHOL/HDL Ratio: 5
VLDL: 30 mg/dL (ref 0.0–40.0)

## 2014-02-25 LAB — COMPREHENSIVE METABOLIC PANEL
ALBUMIN: 4.1 g/dL (ref 3.5–5.2)
ALT: 20 U/L (ref 0–35)
AST: 22 U/L (ref 0–37)
Alkaline Phosphatase: 69 U/L (ref 39–117)
BUN: 10 mg/dL (ref 6–23)
CALCIUM: 9.2 mg/dL (ref 8.4–10.5)
CHLORIDE: 104 meq/L (ref 96–112)
CO2: 27 mEq/L (ref 19–32)
Creatinine, Ser: 0.8 mg/dL (ref 0.4–1.2)
GFR: 81.44 mL/min (ref >60.00)
GLUCOSE: 95 mg/dL (ref 70–99)
POTASSIUM: 4.1 meq/L (ref 3.5–5.1)
Sodium: 139 mEq/L (ref 135–145)
Total Bilirubin: 0.5 mg/dL (ref 0.2–1.2)
Total Protein: 7.1 g/dL (ref 6.0–8.3)

## 2014-02-25 LAB — CBC WITH DIFFERENTIAL/PLATELET
BASOS PCT: 0.6 % (ref 0.0–3.0)
Basophils Absolute: 0 10*3/uL (ref 0.0–0.1)
EOS ABS: 0.1 10*3/uL (ref 0.0–0.7)
Eosinophils Relative: 2 % (ref 0.0–5.0)
HCT: 44.4 % (ref 36.0–46.0)
HEMOGLOBIN: 14.7 g/dL (ref 12.0–15.0)
LYMPHS PCT: 31.7 % (ref 12.0–46.0)
Lymphs Abs: 1.4 10*3/uL (ref 0.7–4.0)
MCHC: 33 g/dL (ref 30.0–36.0)
MCV: 92.8 fl (ref 78.0–100.0)
MONO ABS: 0.4 10*3/uL (ref 0.1–1.0)
Monocytes Relative: 8.8 % (ref 3.0–12.0)
NEUTROS ABS: 2.6 10*3/uL (ref 1.4–7.7)
Neutrophils Relative %: 56.9 % (ref 43.0–77.0)
Platelets: 250 10*3/uL (ref 150.0–400.0)
RBC: 4.78 Mil/uL (ref 3.87–5.11)
RDW: 13.4 % (ref 11.5–15.5)
WBC: 4.5 10*3/uL (ref 4.0–10.5)

## 2014-02-25 LAB — TSH: TSH: 4.72 u[IU]/mL — ABNORMAL HIGH (ref 0.35–4.50)

## 2014-02-26 ENCOUNTER — Other Ambulatory Visit: Payer: 59

## 2014-03-03 ENCOUNTER — Encounter: Payer: Self-pay | Admitting: Family Medicine

## 2014-03-03 ENCOUNTER — Ambulatory Visit: Payer: Self-pay | Admitting: Family Medicine

## 2014-03-05 ENCOUNTER — Encounter: Payer: 59 | Admitting: Family Medicine

## 2014-03-06 DIAGNOSIS — I700 Atherosclerosis of aorta: Secondary | ICD-10-CM

## 2014-03-06 DIAGNOSIS — K760 Fatty (change of) liver, not elsewhere classified: Secondary | ICD-10-CM

## 2014-03-06 HISTORY — DX: Fatty (change of) liver, not elsewhere classified: K76.0

## 2014-03-06 HISTORY — DX: Atherosclerosis of aorta: I70.0

## 2014-03-09 ENCOUNTER — Ambulatory Visit (INDEPENDENT_AMBULATORY_CARE_PROVIDER_SITE_OTHER): Payer: 59 | Admitting: Family Medicine

## 2014-03-09 ENCOUNTER — Encounter: Payer: Self-pay | Admitting: Family Medicine

## 2014-03-09 VITALS — BP 132/82 | HR 85 | Temp 98.4°F | Ht 64.0 in | Wt 158.8 lb

## 2014-03-09 DIAGNOSIS — Z01419 Encounter for gynecological examination (general) (routine) without abnormal findings: Secondary | ICD-10-CM

## 2014-03-09 DIAGNOSIS — E785 Hyperlipidemia, unspecified: Secondary | ICD-10-CM

## 2014-03-09 DIAGNOSIS — Z0000 Encounter for general adult medical examination without abnormal findings: Secondary | ICD-10-CM

## 2014-03-09 DIAGNOSIS — Z1283 Encounter for screening for malignant neoplasm of skin: Secondary | ICD-10-CM

## 2014-03-09 DIAGNOSIS — R928 Other abnormal and inconclusive findings on diagnostic imaging of breast: Secondary | ICD-10-CM

## 2014-03-09 DIAGNOSIS — K219 Gastro-esophageal reflux disease without esophagitis: Secondary | ICD-10-CM

## 2014-03-09 DIAGNOSIS — C801 Malignant (primary) neoplasm, unspecified: Secondary | ICD-10-CM

## 2014-03-09 DIAGNOSIS — Z23 Encounter for immunization: Secondary | ICD-10-CM

## 2014-03-09 MED ORDER — ROSUVASTATIN CALCIUM 10 MG PO TABS: 10.0000 mg | ORAL_TABLET | Freq: Every day | ORAL | Status: DC

## 2014-03-09 NOTE — Progress Notes (Signed)
Subjective:   Patient ID: Anna Randolph, female    DOB: [birth year], 64 y.o.   MRN: [id number]  ROSALAND Randolph is a pleasant 64 y.o. year old female who presents to clinic today with Annual Exam and Gastrophageal Reflux  on 03/09/2014  HPI:  Remote h/o hysterectomy. Colonoscopy 2011 Pneumovax 03/06/2010 Mammogram 03/03/14- ? Calcifications, recommended 6 month follow up.  She would like my opinion on this.  Zoster ? Tdap 06/04/2004 Influenza 1023/15  She is a breast CA survivor- s/p lumpectomy, chemo and radiation (2010)- followed by Dr. Jamal Collin and [facility].  UTD on mammograms.  GERD- taking prilosec OTC prn.  Has noticed some worsening of her reflux at night, especially if she drinks wine. No black stools.  No abdominal pain or fullness.  No nausea or vomiting.  HLD- very elevated. Lipitor caused myalgias.  Crestor was effective without myalgias but became cost prohibitive. Lab Results  Component Value Date   CHOL 345* 02/25/2014   HDL 70.80 02/25/2014   LDLCALC 244* 02/25/2014   LDLDIRECT 111.0 02/25/2013   TRIG 150.0* 02/25/2014   CHOLHDL 5 02/25/2014  ` Lab Results  Component Value Date   CREATININE 0.8 02/25/2014   Lab Results  Component Value Date   NA 139 02/25/2014   K 4.1 02/25/2014   CL 104 02/25/2014   CO2 27 02/25/2014   Lab Results  Component Value Date   WBC 4.5 02/25/2014   HGB 14.7 02/25/2014   HCT 44.4 02/25/2014   MCV 92.8 02/25/2014   PLT 250.0 02/25/2014    Current Outpatient Prescriptions on File Prior to Visit  Medication Sig Dispense Refill  . aspirin 81 MG tablet Take 81 mg by mouth daily.    . Cholecalciferol (VITAMIN D3) 2000 UNITS TABS Take 1 Units by mouth.    . Coenzyme Q10 (CO Q 10) 10 MG CAPS Take 1 tablet by mouth daily.    Marland Kitchen lactobacillus acidophilus (BACID) TABS Take 1 tablet by mouth daily as needed.     . Misc Natural Products (DAILY HERBS ENERGY PO) Take 1 capsule by mouth.     No current facility-administered  medications on file prior to visit.    Allergies  Allergen Reactions  . Sulfa Antibiotics Anaphylaxis    Throat Swells  . Sulfonamide Derivatives Anaphylaxis    Throat Swells  . Lipitor [Atorvastatin] Other (See Comments)    ? Passed out  . Latex Rash  . Tape Rash    Adhesive bandages    Past Medical History  Diagnosis Date  . Personal history of tobacco use, presenting hazards to health   . Personal history of malignant neoplasm of breast   . Mammographic microcalcification 2013  . Special screening for malignant neoplasms, colon   . 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 Malignant neoplasm of upper-outer quadrant of female breast   . Meningitis as infant  . Shingles 2014    Past Surgical History  Procedure Laterality Date  . Colonoscopy  5732,2025    Dr. Vira Agar  . Portacath placement  2010    placement  . Breast lumpectomy Right 2010    sentinel node bx  . Abdominal hysterectomy  1995  . Tonsillectomy  1960  . Tubal ligation    . Port-a-cath removal  04/2010  . Breast biopsy Right Jan 2014    stereo    Family History  Problem Relation Age of Onset  . Leukemia Brother  History   Social History  . Marital Status: Married    Spouse Name: N/A    Number of Children: N/A  . Years of Education: N/A   Occupational History  . Not on file.   Social History Main Topics  . Smoking status: Former Smoker -- 18 years  . Smokeless tobacco: Never Used     Comment: quit 1990  . Alcohol Use: 0.0 oz/week    5-6 Glasses of wine per week  . Drug Use: No  . Sexual Activity: Not on file   Other Topics Concern  . Not on file   Social History Narrative   The PMH, PSH, Social History, Family History, Medications, and allergies have been reviewed in [facility], and have been updated if relevant.  Review of Systems  Constitutional: Negative.   HENT: Negative.   Respiratory: Negative.   Cardiovascular: Negative.     Gastrointestinal: Negative for nausea, vomiting, abdominal pain, diarrhea, constipation, blood in stool, abdominal distention and anal bleeding.  Endocrine: Negative.   Genitourinary: Negative.   Musculoskeletal: Negative.   Skin: Negative.   Allergic/Immunologic: Negative.   Neurological: Negative.   Hematological: Negative.   Psychiatric/Behavioral: Negative.        Objective:    BP 132/82 mmHg  Pulse 85  Temp(Src) 98.4 F (36.9 C) (Oral)  Ht 5\' 4"  (1.626 m)  Wt 158 lb 12 oz (72.009 kg)  BMI 27.24 kg/m2  SpO2 97%   Physical Exam  General:  Well-developed,well-nourished,in no acute distress; alert,appropriate and cooperative throughout examination Head:  normocephalic and atraumatic.   Eyes:  vision grossly intact, pupils equal, pupils round, and pupils reactive to light.   Ears:  R ear normal and L ear normal.   Nose:  no external deformity.   Mouth:  good dentition.   Neck:  No deformities, masses, or tenderness noted. Breasts:  No mass, nodules, thickening, tenderness, bulging, retraction, inflamation, nipple discharge or skin changes noted.   Lungs:  Normal respiratory effort, chest expands symmetrically. Lungs are clear to auscultation, no crackles or wheezes. Heart:  Normal rate and regular rhythm. S1 and S2 normal without gallop, murmur, click, rub or other extra sounds. Abdomen:  Bowel sounds positive,abdomen soft and non-tender without masses, organomegaly or hernias noted. Rectal:  no external abnormalities.   Genitalia:  Pelvic Exam:        External: normal female genitalia without lesions or masses        Vagina: normal without lesions or masses         Adnexa: normal bimanual exam without masses or fullness        Uterus: absent  Msk:  No deformity or scoliosis noted of thoracic or lumbar spine.   Extremities:  No clubbing, cyanosis, edema, or deformity noted with normal full range of motion of all joints.   Neurologic:  alert & oriented X3 and gait  normal.   Skin:  Intact without suspicious lesions or rashes Cervical Nodes:  No lymphadenopathy noted Axillary Nodes:  No palpable lymphadenopathy Psych:  Cognition and judgment appear intact. Alert and cooperative with normal attention span and concentration. No apparent delusions, illusions, hallucinations        Assessment & Plan:   Well woman exam  HLD (hyperlipidemia)  Gastroesophageal reflux disease, esophagitis presence not specified No Follow-up on file.

## 2014-03-09 NOTE — Assessment & Plan Note (Signed)
Reviewed preventive care protocols, scheduled due services, and updated immunizations Discussed nutrition, exercise, diet, and healthy lifestyle.  BME today. Prevnar 13 given today.

## 2014-03-09 NOTE — Assessment & Plan Note (Signed)
Reviewed report with pt. She would like to schedule follow up mammogram in 6 months rather than proceeding with biopsy.  Dr. Brigitte Pulse, radiologist, per pt, agreed with her decision. Advised her to discuss this further with Dr. Bryson Ha but this seems reasonable to me as well.

## 2014-03-09 NOTE — Progress Notes (Signed)
Pre visit review using our clinic review tool, if applicable. No additional management support is needed unless otherwise documented below in the visit note. 

## 2014-03-09 NOTE — Patient Instructions (Addendum)
Great to see you. We are starting Crestor 10 mg daily. Please return in 4-8 weeks to recheck labs. Please call the breast center to schedule your 6 month follow up mammogram.  Happy birthday!

## 2014-03-09 NOTE — Assessment & Plan Note (Signed)
Deteriorated. Advised adding an H2 blocker. Also discussed reflux friendly diet. Call or return to clinic prn if these symptoms worsen or fail to improve as anticipated. The patient indicates understanding of these issues and agrees with the plan.

## 2014-03-09 NOTE — Assessment & Plan Note (Signed)
Deteriorated. Discussed tx options- will restart Crestor 10 mg daily. Follow up labs in 4-8 weeks.

## 2014-03-16 ENCOUNTER — Ambulatory Visit: Payer: Self-pay | Admitting: Oncology

## 2014-03-16 LAB — COMPREHENSIVE METABOLIC PANEL
ANION GAP: 9 (ref 7–16)
Albumin: 3.9 g/dL (ref 3.4–5.0)
Alkaline Phosphatase: 89 U/L
BUN: 14 mg/dL (ref 7–18)
Bilirubin,Total: 0.2 mg/dL (ref 0.2–1.0)
CHLORIDE: 101 mmol/L (ref 98–107)
CO2: 29 mmol/L (ref 21–32)
Calcium, Total: 8.9 mg/dL (ref 8.5–10.1)
Creatinine: 1.09 mg/dL (ref 0.60–1.30)
EGFR (African American): >60
EGFR (Non-African Amer.): 54 — ABNORMAL LOW
Glucose: 96 mg/dL (ref 65–99)
OSMOLALITY: 278 (ref 275–301)
POTASSIUM: 3.7 mmol/L (ref 3.5–5.1)
SGOT(AST): 24 U/L (ref 15–37)
SGPT (ALT): 36 U/L
Sodium: 139 mmol/L (ref 136–145)
TOTAL PROTEIN: 7.5 g/dL (ref 6.4–8.2)

## 2014-03-16 LAB — CBC CANCER CENTER
BASOS ABS: 0.1 x10 3/mm (ref 0.0–0.1)
Basophil %: 1.2 %
Eosinophil #: 0.1 x10 3/mm (ref 0.0–0.7)
Eosinophil %: 1.9 %
HCT: 43 % (ref 35.0–47.0)
HGB: 14.3 g/dL (ref 12.0–16.0)
LYMPHS ABS: 1.4 x10 3/mm (ref 1.0–3.6)
Lymphocyte %: 25.5 %
MCH: 30.8 pg (ref 26.0–34.0)
MCHC: 33.3 g/dL (ref 32.0–36.0)
MCV: 92 fL (ref 80–100)
MONO ABS: 0.5 x10 3/mm (ref 0.2–0.9)
MONOS PCT: 10 %
NEUTROS ABS: 3.3 x10 3/mm (ref 1.4–6.5)
Neutrophil %: 61.4 %
Platelet: 244 x10 3/mm (ref 150–440)
RBC: 4.66 10*6/uL (ref 3.80–5.20)
RDW: 13 % (ref 11.5–14.5)
WBC: 5.4 x10 3/mm (ref 3.6–11.0)

## 2014-03-18 LAB — CANCER ANTIGEN 27.29: CA 27.29: 14.9 U/mL (ref 0.0–38.6)

## 2014-04-06 ENCOUNTER — Ambulatory Visit: Payer: Self-pay | Admitting: Oncology

## 2014-04-22 ENCOUNTER — Other Ambulatory Visit: Payer: Self-pay | Admitting: Internal Medicine

## 2014-04-22 DIAGNOSIS — Z0000 Encounter for general adult medical examination without abnormal findings: Secondary | ICD-10-CM

## 2014-04-24 ENCOUNTER — Other Ambulatory Visit (INDEPENDENT_AMBULATORY_CARE_PROVIDER_SITE_OTHER): Payer: 59

## 2014-04-24 DIAGNOSIS — Z0000 Encounter for general adult medical examination without abnormal findings: Secondary | ICD-10-CM

## 2014-04-24 DIAGNOSIS — E785 Hyperlipidemia, unspecified: Secondary | ICD-10-CM

## 2014-04-24 LAB — CBC
HEMATOCRIT: 43.8 % (ref 36.0–46.0)
Hemoglobin: 15 g/dL (ref 12.0–15.0)
MCHC: 34.3 g/dL (ref 30.0–36.0)
MCV: 91.5 fl (ref 78.0–100.0)
PLATELETS: 267 10*3/uL (ref 150.0–400.0)
RBC: 4.78 Mil/uL (ref 3.87–5.11)
RDW: 13.3 % (ref 11.5–15.5)
WBC: 4.2 10*3/uL (ref 4.0–10.5)

## 2014-04-24 LAB — LIPID PANEL
CHOLESTEROL: 214 mg/dL — AB (ref 0–200)
HDL: 85.6 mg/dL (ref >39.00)
LDL Cholesterol: 101 mg/dL — ABNORMAL HIGH (ref 0–99)
NonHDL: 128.4
Total CHOL/HDL Ratio: 3
Triglycerides: 136 mg/dL (ref 0.0–149.0)
VLDL: 27.2 mg/dL (ref 0.0–40.0)

## 2014-04-24 LAB — COMPREHENSIVE METABOLIC PANEL
ALBUMIN: 4.3 g/dL (ref 3.5–5.2)
ALT: 25 U/L (ref 0–35)
AST: 25 U/L (ref 0–37)
Alkaline Phosphatase: 79 U/L (ref 39–117)
BUN: 14 mg/dL (ref 6–23)
CALCIUM: 9.9 mg/dL (ref 8.4–10.5)
CHLORIDE: 103 meq/L (ref 96–112)
CO2: 26 meq/L (ref 19–32)
Creatinine, Ser: 0.82 mg/dL (ref 0.40–1.20)
GFR: 74.57 mL/min (ref >60.00)
Glucose, Bld: 95 mg/dL (ref 70–99)
Potassium: 4 mEq/L (ref 3.5–5.1)
Sodium: 138 mEq/L (ref 135–145)
TOTAL PROTEIN: 7.4 g/dL (ref 6.0–8.3)
Total Bilirubin: 0.5 mg/dL (ref 0.2–1.2)

## 2014-04-24 LAB — TSH: TSH: 4.87 u[IU]/mL — ABNORMAL HIGH (ref 0.35–4.50)

## 2014-04-27 ENCOUNTER — Other Ambulatory Visit: Payer: 59

## 2014-05-06 ENCOUNTER — Other Ambulatory Visit: Payer: Self-pay | Admitting: Family Medicine

## 2014-05-20 ENCOUNTER — Encounter: Payer: Self-pay | Admitting: Primary Care

## 2014-05-20 ENCOUNTER — Ambulatory Visit (INDEPENDENT_AMBULATORY_CARE_PROVIDER_SITE_OTHER): Payer: 59 | Admitting: Primary Care

## 2014-05-20 VITALS — BP 146/88 | HR 90 | Temp 99.5°F | Ht 64.0 in | Wt 159.4 lb

## 2014-05-20 DIAGNOSIS — R6889 Other general symptoms and signs: Secondary | ICD-10-CM | POA: Diagnosis not present

## 2014-05-20 DIAGNOSIS — J101 Influenza due to other identified influenza virus with other respiratory manifestations: Secondary | ICD-10-CM

## 2014-05-20 LAB — POCT INFLUENZA A/B
INFLUENZA A, POC: POSITIVE
INFLUENZA B, POC: NEGATIVE

## 2014-05-20 MED ORDER — BENZONATATE 100 MG PO CAPS: 100.0000 mg | ORAL_CAPSULE | Freq: Three times a day (TID) | ORAL | Status: DC | PRN

## 2014-05-20 MED ORDER — FLUTICASONE PROPIONATE 50 MCG/ACT NA SUSP: 2.0000 | Freq: Every day | NASAL | Status: DC

## 2014-05-20 MED ORDER — OSELTAMIVIR PHOSPHATE 75 MG PO CAPS: 75.0000 mg | ORAL_CAPSULE | Freq: Two times a day (BID) | ORAL | Status: DC

## 2014-05-20 NOTE — Progress Notes (Signed)
Pre visit review using our clinic review tool, if applicable. No additional management support is needed unless otherwise documented below in the visit note. 

## 2014-05-20 NOTE — Patient Instructions (Signed)
Your influenza test was positive for Type A. Start Tamiflu tablets to decrease symptoms of flu. One tablet by mouth twice daily for 5 days. Use Flonase nasal spray for nasal decongestion. Start Tessalon Pearls three times daily as needed for cough. Drink plenty of fluids including water. Tylenol for body aches and fevers. Take care! I hope you feel better soon!

## 2014-05-20 NOTE — Progress Notes (Signed)
Subjective:    Patient ID: Anna Randolph, female    DOB: 1951/02/23, 64 y.o.   MRN: [id number]  HPI  Anna Randolph is a 64 year old female who presents today with a chief complaint of body aches, fever, and non-productive cough that has been present for 3 days. Symptoms began with a scratchy throat Sunday and significantly worsened Monday with additional symptoms of left ear pain, decrease in appetite, and generalized back aches. The body aches are the most bothersome symptom, but she also has developed lateral wall rib pain from her cough.  Denies dysuria, frequency urgency. She thought she may have been constipated due to the back aches, so she took epsome salt which helped to cleared out her bowels but did not resolve her back aches. She took sudafed last night which helped to dry up her nasal congestion, and took tessalon pearls for cough which helped alleviate her cough. She's taken ibuprofen without relief of her body aches. She has had the flu shot this year. Recently traveled to [place] last week.  Nothing in particular makes symptoms worse.  Review of Systems  Constitutional: Positive for fever, chills and fatigue.  HENT: Positive for congestion, ear pain and sinus pressure. Negative for postnasal drip and sore throat.   Eyes: Negative for itching.  Respiratory: Positive for cough and chest tightness.        Sore chest wall pain when coughing.  Gastrointestinal: Positive for nausea. Negative for vomiting and abdominal pain.  Genitourinary: Negative for dysuria, urgency, frequency, flank pain and pelvic pain.  Musculoskeletal: Positive for myalgias and back pain.  Skin: Negative for rash.  Neurological: Positive for headaches. Negative for dizziness.       Past Medical History  Diagnosis Date  . Personal history of tobacco use, presenting hazards to health   . Personal history of malignant neoplasm of breast   . Mammographic microcalcification 2013  . Special screening for  malignant neoplasms, colon   . 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 Malignant neoplasm of upper-outer quadrant of female breast   . Meningitis as infant  . Shingles 2014    History   Social History  . Marital Status: Married    Spouse Name: N/A  . Number of Children: N/A  . Years of Education: N/A   Occupational History  . Not on file.   Social History Main Topics  . Smoking status: Former Smoker -- 18 years  . Smokeless tobacco: Never Used     Comment: quit 1990  . Alcohol Use: 0.0 oz/week    5-6 Glasses of wine per week  . Drug Use: No  . Sexual Activity: Not on file   Other Topics Concern  . Not on file   Social History Narrative    Past Surgical History  Procedure Laterality Date  . Colonoscopy  5366,4403    Dr. Vira Agar  . Portacath placement  2010    placement  . Breast lumpectomy Right 2010    sentinel node bx  . Abdominal hysterectomy  1995  . Tonsillectomy  1960  . Tubal ligation    . Port-a-cath removal  04/2010  . Breast biopsy Right Jan 2014    stereo    Family History  Problem Relation Age of Onset  . Leukemia Brother     Allergies  Allergen Reactions  . Sulfa Antibiotics Anaphylaxis    Throat Swells  . Sulfonamide Derivatives Anaphylaxis  Throat Swells  . Lipitor [Atorvastatin] Other (See Comments)    ? Passed out  . Latex Rash  . Tape Rash    Adhesive bandages    Current Outpatient Prescriptions on File Prior to Visit  Medication Sig Dispense Refill  . aspirin 81 MG tablet Take 81 mg by mouth daily.    . Cholecalciferol (VITAMIN D3) 2000 UNITS TABS Take 1 Units by mouth.    . Coenzyme Q10 (CO Q 10) 10 MG CAPS Take 1 tablet by mouth daily.    . CRESTOR 10 MG tablet TAKE 1 TABLET BY MOUTH DAILY 30 tablet 5  . lactobacillus acidophilus (BACID) TABS Take 1 tablet by mouth daily as needed.     . Magnesium 250 MG TABS Take by mouth.    . Misc Natural Products (DAILY HERBS ENERGY PO) Take 1  capsule by mouth.     No current facility-administered medications on file prior to visit.    BP 146/88 mmHg  Pulse 90  Temp(Src) 99.5 F (37.5 C) (Oral)  Ht 5\' 4"  (1.626 m)  Wt 159 lb 6.4 oz (72.303 kg)  BMI 27.35 kg/m2  SpO2 97%    Objective:   Physical Exam  Constitutional: She is oriented to person, place, and time. She appears well-developed.  HENT:  Head: Normocephalic.  Right Ear: External ear normal.  Left Ear: External ear normal.  Nose: Nose normal.  Mouth/Throat: Oropharynx is clear and moist.  Eyes: Conjunctivae are normal. Pupils are equal, round, and reactive to light.  Neck: Neck supple.  Cardiovascular: Normal rate and regular rhythm.   Pulmonary/Chest: Effort normal and breath sounds normal.  Genitourinary:  No CVA tenderness.  Musculoskeletal: She exhibits tenderness.  Reproducible pain to posterior trunk (generalized).  Lymphadenopathy:    She has no cervical adenopathy.  Neurological: She is alert and oriented to person, place, and time.  Skin: Skin is warm and dry.  Psychiatric: She has a normal mood and affect.          Assessment & Plan:  Positive influenza test

## 2014-05-20 NOTE — Assessment & Plan Note (Addendum)
Positive influenza test. Tamiflu BID for 5 days. Flonase for nasal congestion, tylenol for fevers/body aches. Tessalon Pearls for cough PRN. Push fluids and rest.

## 2014-06-16 ENCOUNTER — Ambulatory Visit (INDEPENDENT_AMBULATORY_CARE_PROVIDER_SITE_OTHER): Payer: 59 | Admitting: Internal Medicine

## 2014-06-16 ENCOUNTER — Encounter: Payer: Self-pay | Admitting: Internal Medicine

## 2014-06-16 VITALS — BP 150/96 | HR 96 | Temp 99.0°F | Wt 159.0 lb

## 2014-06-16 DIAGNOSIS — J208 Acute bronchitis due to other specified organisms: Secondary | ICD-10-CM | POA: Diagnosis not present

## 2014-06-16 MED ORDER — HYDROCODONE-HOMATROPINE 5-1.5 MG/5ML PO SYRP: 5.0000 mL | ORAL_SOLUTION | Freq: Three times a day (TID) | ORAL | Status: DC | PRN

## 2014-06-16 MED ORDER — ALBUTEROL SULFATE HFA 108 (90 BASE) MCG/ACT IN AERS: 2.0000 | INHALATION_SPRAY | Freq: Four times a day (QID) | RESPIRATORY_TRACT | Status: DC | PRN

## 2014-06-16 NOTE — Progress Notes (Signed)
HPI  Pt presents to the clinic today with c/o cough and chest congestion. She reports this started 3 days ago. The cough is productive of thin, white mucous. She has had some associated shortness of breath. She denies fever, chills or body aches. She has tried Human resources officer, Flonase, and Mucinex without much relief. She has no history of allergies or breathing problems. She has had sick contacts. She did have the flu 1 month ago.  Review of Systems      Past Medical History  Diagnosis Date  . Personal history of tobacco use, presenting hazards to health   . Personal history of malignant neoplasm of breast   . Mammographic microcalcification 2013  . Special screening for malignant neoplasms, colon   . 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 Malignant neoplasm of upper-outer quadrant of female breast   . Meningitis as infant  . Shingles 2014    Family History  Problem Relation Age of Onset  . Leukemia Brother     History   Social History  . Marital Status: Married    Spouse Name: N/A  . Number of Children: N/A  . Years of Education: N/A   Occupational History  . Not on file.   Social History Main Topics  . Smoking status: Former Smoker -- 18 years  . Smokeless tobacco: Never Used     Comment: quit 1990  . Alcohol Use: 0.0 oz/week    5-6 Glasses of wine per week  . Drug Use: No  . Sexual Activity: Not on file   Other Topics Concern  . Not on file   Social History Narrative    Allergies  Allergen Reactions  . Sulfa Antibiotics Anaphylaxis    Throat Swells  . Sulfonamide Derivatives Anaphylaxis    Throat Swells  . Lipitor [Atorvastatin] Other (See Comments)    ? Passed out  . Latex Rash  . Tape Rash    Adhesive bandages     Constitutional: Denies headache, fatigue, fever or abrupt weight changes.  HEENT:  Denies eye redness, eye pain, pressure behind the eyes, facial pain, nasal congestion, ear pain, ringing in the ears, wax  buildup, runny nose or sore throat. Respiratory: Positive cough. Denies difficulty breathing or shortness of breath.  Cardiovascular: Denies chest pain, chest tightness, palpitations or swelling in the hands or feet.   No other specific complaints in a complete review of systems (except as listed in HPI above).  Objective:   BP 150/96 mmHg  Pulse 96  Temp(Src) 99 F (37.2 C) (Oral)  Wt 159 lb (72.122 kg)  SpO2 98% Wt Readings from Last 3 Encounters:  06/16/14 159 lb (72.122 kg)  05/20/14 159 lb 6.4 oz (72.303 kg)  03/09/14 158 lb 12 oz (72.009 kg)    General: Appears her stated age, well developed, well nourished in NAD. HEENT: Head: normal shape and size; Eyes: sclera white, no icterus, conjunctiva pink,; Ears: Tm's gray and intact, normal light reflex; Throat/Mouth:  Teeth present, mucosa erythematous and moist, no exudate noted, no lesions or ulcerations noted.  Neck: No lymphadenopathy.   Cardiovascular: Normal rate and rhythm. S1,S2 noted.  ? Gallop. No murmur, rubs noted.  Pulmonary/Chest: Normal effort and bilateral expiratory wheezing noted. No respiratory distress. No rales or ronchi noted.      Assessment & Plan:   Acute bronchitis:  Likely viral Get some rest and drink plenty of water eRx for Albuterol inhaler eRx for Hycodan cough  syrup Continue Mucinex, Flonase and Allegra for now  RTC as needed or if symptoms persist.

## 2014-06-16 NOTE — Patient Instructions (Signed)

## 2014-06-19 ENCOUNTER — Telehealth: Payer: Self-pay

## 2014-06-19 NOTE — Telephone Encounter (Signed)
She can take albuterol and pulmicort together. Her pulmicort should be used daily and albuterol every 4-6 hours as needed. B12 may increase her energy level but she can not have this done without checking b12 levels first

## 2014-06-19 NOTE — Telephone Encounter (Signed)
Pt left v/m; pt was seen 06/16/14 with bronchitis; pt continues with cough and wheezing. Pt has a pulmicort inhaler at home and pt wants to know if can use pulmicort inhaler with albuterol inhaler; pt also wants to know if getting a Vit B 12 shot might boost pts immune system. Pt request cb.

## 2014-06-22 ENCOUNTER — Encounter: Payer: Self-pay | Admitting: Primary Care

## 2014-06-22 ENCOUNTER — Ambulatory Visit (INDEPENDENT_AMBULATORY_CARE_PROVIDER_SITE_OTHER)
Admission: RE | Admit: 2014-06-22 | Discharge: 2014-06-22 | Disposition: A | Discharge status: Home / Self Care | Payer: 59 | Source: Ambulatory Visit | Attending: Primary Care | Admitting: Primary Care

## 2014-06-22 ENCOUNTER — Ambulatory Visit (INDEPENDENT_AMBULATORY_CARE_PROVIDER_SITE_OTHER): Payer: 59 | Admitting: Primary Care

## 2014-06-22 ENCOUNTER — Telehealth: Payer: Self-pay | Admitting: Family Medicine

## 2014-06-22 VITALS — BP 148/96 | HR 87 | Temp 98.0°F | Ht 64.0 in | Wt 158.8 lb

## 2014-06-22 DIAGNOSIS — R059 Cough, unspecified: Secondary | ICD-10-CM

## 2014-06-22 DIAGNOSIS — J209 Acute bronchitis, unspecified: Secondary | ICD-10-CM

## 2014-06-22 DIAGNOSIS — R05 Cough: Secondary | ICD-10-CM

## 2014-06-22 MED ORDER — DOXYCYCLINE HYCLATE 100 MG PO TABS: 100.0000 mg | ORAL_TABLET | Freq: Two times a day (BID) | ORAL | Status: DC

## 2014-06-22 NOTE — Telephone Encounter (Signed)
I notified Anna Randolph of her xray results. She wills start Doxycycline twice daily for 10 days. She will also be getting a CT chest as recommended for follow up of left nodule.   Please notify Anna Randolph that she will need to come in to have her BUN/Creatinine drawn prior to her CT. I've ordered the lab, she just needs to complete this prior to the CT scan.  Thanks!

## 2014-06-22 NOTE — Progress Notes (Signed)
Subjective:    Patient ID: Anna Randolph, female    DOB: [birth year], 64 y.o.   MRN: [id number]  HPI  [name] is a 64 year old female who presents today with a chief complaint of cough, nasal congestion, and wheezing since April 10th. She was evaluated on 06/16/14 for same and was instructed to use an albuterol inhaler, Hycodan, allegra, Mucinex-D. She's been taking these medications without any noticeable difference in her symptoms. She does not feel any worse, but has had no improvement in her symptoms. Denies fevers, chills, sore throat, respiratory distress. She reports some nasal irritation since using the Flonase with slight bloody rhinorrhea.   Review of Systems  Constitutional: Negative for fever and chills.  HENT: Positive for congestion, ear pain, postnasal drip, rhinorrhea and sore throat.        Sore teeth to left upper side. Slight sinus pressure.  Respiratory: Positive for cough and shortness of breath.   Cardiovascular: Negative for chest pain.  Gastrointestinal: Negative for nausea and vomiting.       Past Medical History  Diagnosis Date  . Personal history of tobacco use, presenting hazards to health   . Personal history of malignant neoplasm of breast   . Mammographic microcalcification 2013  . Special screening for malignant neoplasms, colon   . 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 Malignant neoplasm of upper-outer quadrant of female breast   . Meningitis as infant  . Shingles 2014    History   Social History  . Marital Status: Married    Spouse Name: N/A  . Number of Children: N/A  . Years of Education: N/A   Occupational History  . Not on file.   Social History Main Topics  . Smoking status: Former Smoker -- 18 years  . Smokeless tobacco: Never Used     Comment: quit 1990  . Alcohol Use: 0.0 oz/week    5-6 Glasses of wine per week  . Drug Use: No  . Sexual Activity: Not on file   Other Topics Concern   . Not on file   Social History Narrative    Past Surgical History  Procedure Laterality Date  . Colonoscopy  3235,5732    Dr. Vira Agar  . Portacath placement  2010    placement  . Breast lumpectomy Right 2010    sentinel node bx  . Abdominal hysterectomy  1995  . Tonsillectomy  1960  . Tubal ligation    . Port-a-cath removal  04/2010  . Breast biopsy Right Jan 2014    stereo    Family History  Problem Relation Age of Onset  . Leukemia Brother     Allergies  Allergen Reactions  . Sulfa Antibiotics Anaphylaxis    Throat Swells  . Sulfonamide Derivatives Anaphylaxis    Throat Swells  . Lipitor [Atorvastatin] Other (See Comments)    ? Passed out  . Latex Rash  . Tape Rash    Adhesive bandages    Current Outpatient Prescriptions on File Prior to Visit  Medication Sig Dispense Refill  . aspirin 81 MG tablet Take 81 mg by mouth daily.    . Cholecalciferol (VITAMIN D3) 2000 UNITS TABS Take 1 Units by mouth.    . Coenzyme Q10 (CO Q 10) 10 MG CAPS Take 1 tablet by mouth daily.    . CRESTOR 10 MG tablet TAKE 1 TABLET BY MOUTH DAILY 30 tablet 5  . fluticasone (FLONASE) 50 MCG/ACT  nasal spray Place 2 sprays into both nostrils daily. 16 g 6  . lactobacillus acidophilus (BACID) TABS Take 1 tablet by mouth daily as needed.     . Magnesium 250 MG TABS Take by mouth.    . Misc Natural Products (DAILY HERBS ENERGY PO) Take 1 capsule by mouth.    Marland Kitchen albuterol (PROVENTIL HFA;VENTOLIN HFA) 108 (90 BASE) MCG/ACT inhaler Inhale 2 puffs into the lungs every 6 (six) hours as needed for wheezing or shortness of breath. (Patient not taking: Reported on 06/22/2014) 1 Inhaler 0  . HYDROcodone-homatropine (HYCODAN) 5-1.5 MG/5ML syrup Take 5 mLs by mouth every 8 (eight) hours as needed for cough. (Patient not taking: Reported on 06/22/2014) 120 mL 0   No current facility-administered medications on file prior to visit.    BP 148/96 mmHg  Pulse 87  Temp(Src) 98 F (36.7 C) (Oral)  Ht 5\' 4"   (1.626 m)  Wt 158 lb 12.8 oz (72.031 kg)  BMI 27.24 kg/m2  SpO2 97%    Objective:   Physical Exam  Constitutional: She is oriented to person, place, and time.  Appears sickly  HENT:  Head: Normocephalic.  Right Ear: External ear normal.  Left Ear: External ear normal.  Nose: Mucosal edema present.  Mouth/Throat: Oropharynx is clear and moist.  Eyes: Conjunctivae are normal. Pupils are equal, round, and reactive to light.  Neck: Neck supple.  Pulmonary/Chest: Effort normal. She has decreased breath sounds in the right lower field. She has rhonchi in the right upper field, the right lower field, the left upper field and the left lower field. She has rales in the right upper field, the right lower field, the left upper field and the left lower field.  Lymphadenopathy:    She has no cervical adenopathy.  Neurological: She is alert and oriented to person, place, and time.  Skin: Skin is warm and dry.          Assessment & Plan:  Bronchitis:  Suspect bacterial involvement due to duration and lack of improvement with symptomatic. Xray completed to rule out pneumonia. No pneumonia. Will obtain CT chest as recommended. Will treat with Doxycycline for 10 days. Follow up in 3-4 days if no improvement on Doxycycline. Will obtain BMP for CT scan. Patient updated on plan of care and verbalizes understanding.  FINDINGS: The lungs are adequately inflated. There is subtle nodular density in the lingula which is new since the previous study. It measures approximately 7 x 10 mm. The right lung is clear. The heart and mediastinal structures are normal. There is no pleural effusion. The bony thorax is unremarkable.  IMPRESSION: Subtle nodularity in the lingula new since the previous study. CT scanning of the chest is recommended.

## 2014-06-22 NOTE — Progress Notes (Signed)
Pre visit review using our clinic review tool, if applicable. No additional management support is needed unless otherwise documented below in the visit note. 

## 2014-06-22 NOTE — Patient Instructions (Signed)
Complete xray(s) prior to leaving today. I will contact you with your results.  Continue Allegra, tessalon pearls, hycodan at night, and albuterol inhaler (as needed).  Please follow up if no improvement in 3-4 days after taking the antibiotics. Take care!

## 2014-06-22 NOTE — Telephone Encounter (Signed)
Left message on voicemail.

## 2014-06-22 NOTE — Telephone Encounter (Signed)
Called and spoken to patient. Lab appt has been schedule for 10 am on 06/23/14.

## 2014-06-22 NOTE — Telephone Encounter (Signed)
Pleas return call to pt. Thanks

## 2014-06-23 ENCOUNTER — Other Ambulatory Visit (INDEPENDENT_AMBULATORY_CARE_PROVIDER_SITE_OTHER): Payer: 59

## 2014-06-23 DIAGNOSIS — J209 Acute bronchitis, unspecified: Secondary | ICD-10-CM

## 2014-06-23 DIAGNOSIS — R059 Cough, unspecified: Secondary | ICD-10-CM

## 2014-06-23 DIAGNOSIS — R05 Cough: Secondary | ICD-10-CM | POA: Diagnosis not present

## 2014-06-23 LAB — BASIC METABOLIC PANEL
BUN: 13 mg/dL (ref 6–23)
CO2: 30 mEq/L (ref 19–32)
CREATININE: 0.79 mg/dL (ref 0.40–1.20)
Calcium: 10.1 mg/dL (ref 8.4–10.5)
Chloride: 100 mEq/L (ref 96–112)
GFR: 77.8 mL/min (ref >60.00)
Glucose, Bld: 109 mg/dL — ABNORMAL HIGH (ref 70–99)
Potassium: 4.1 mEq/L (ref 3.5–5.1)
Sodium: 137 mEq/L (ref 135–145)

## 2014-06-23 NOTE — Telephone Encounter (Signed)
Pt is aware per telephone encounter from 06/22/2014--Dr Deborra Medina

## 2014-06-24 ENCOUNTER — Ambulatory Visit (INDEPENDENT_AMBULATORY_CARE_PROVIDER_SITE_OTHER)
Admission: RE | Admit: 2014-06-24 | Discharge: 2014-06-24 | Disposition: A | Discharge status: Home / Self Care | Payer: 59 | Source: Ambulatory Visit | Attending: Primary Care | Admitting: Primary Care

## 2014-06-24 DIAGNOSIS — R05 Cough: Secondary | ICD-10-CM | POA: Diagnosis not present

## 2014-06-24 MED ORDER — IOHEXOL 300 MG/ML  SOLN
80.0000 mL | Freq: Once | INTRAMUSCULAR | Status: AC | PRN
  Administered 2014-06-24: 80 mL via INTRAVENOUS

## 2014-06-26 ENCOUNTER — Inpatient Hospital Stay: Admission: RE | Admit: 2014-06-26 | Payer: 59 | Source: Ambulatory Visit

## 2014-07-20 ENCOUNTER — Encounter: Payer: Self-pay | Admitting: Family Medicine

## 2014-07-20 ENCOUNTER — Other Ambulatory Visit: Payer: Self-pay | Admitting: Family Medicine

## 2014-07-20 DIAGNOSIS — R928 Other abnormal and inconclusive findings on diagnostic imaging of breast: Secondary | ICD-10-CM

## 2014-07-23 ENCOUNTER — Ambulatory Visit (INDEPENDENT_AMBULATORY_CARE_PROVIDER_SITE_OTHER): Payer: 59 | Admitting: Family Medicine

## 2014-07-23 ENCOUNTER — Encounter: Payer: Self-pay | Admitting: Family Medicine

## 2014-07-23 VITALS — BP 130/84 | HR 82 | Temp 98.0°F | Wt 160.5 lb

## 2014-07-23 DIAGNOSIS — J019 Acute sinusitis, unspecified: Secondary | ICD-10-CM | POA: Diagnosis not present

## 2014-07-23 MED ORDER — AMOXICILLIN-POT CLAVULANATE 875-125 MG PO TABS: 1.0000 | ORAL_TABLET | Freq: Two times a day (BID) | ORAL | Status: AC

## 2014-07-23 NOTE — Progress Notes (Signed)
BP 130/84 mmHg  Pulse 82  Temp(Src) 98 F (36.7 C) (Oral)  Wt 160 lb 8 oz (72.802 kg)  SpO2 96%   CC: nasal congestion with headache  Subjective:    Patient ID: Anna Randolph, female    DOB: Aug 01, 1950, 64 y.o.   MRN: [id number]  HPI: Anna Randolph is a 64 y.o. female presenting on 07/23/2014 for Nasal Congestion; Headache; and Cough   Seen here several times in the past 2 months:  05/20/2014 - influenza, treated with tamiflu BID x 5 days, flonase, tessalon perls. 06/16/2014 - acute viral bronchitis treated with albuterol inhaler and hycodan cough syrup, continued mucinex, flonase, allegra 06/21/2013 - bacterial bronchitis treated with doxycycline 10d course and xray showing subtle nodularity in lingula. CT reassuring (see below)  >She did have full recoveries between illnesses. <  Started feeling ill again on Tuesday - ST, head congestion with headache and cough productive of colored mucous. + congested ears. Episode of sweats this morning. Occasional night sweats - ever since completed chemo for breast cancer (5 yrs ago). Head = chest congestion.   No fevers, ear or tooth pain, PNdrainage.  So far has tried mucinex DM and advil for headache which helps.   She did travel recently to [facility].  No other recent travel outside of states, no working at homeless shelters or prisons, no TB exposure.  No sick contacts at home. + husband smokes outside No h/o asthma. Was dx with COPD years ago around where she had pneumonia.  Relevant past medical, surgical, family and social history reviewed and updated as indicated. Interim medical history since our last visit reviewed. Allergies and medications reviewed and updated. Current Outpatient Prescriptions on File Prior to Visit  Medication Sig  . albuterol (PROVENTIL HFA;VENTOLIN HFA) 108 (90 BASE) MCG/ACT inhaler Inhale 2 puffs into the lungs every 6 (six) hours as needed for wheezing or shortness of breath.  Marland Kitchen aspirin 81 MG  tablet Take 81 mg by mouth daily.  . Cholecalciferol (VITAMIN D3) 2000 UNITS TABS Take 1 Units by mouth.  . Coenzyme Q10 (CO Q 10) 10 MG CAPS Take 1 tablet by mouth daily.  . CRESTOR 10 MG tablet TAKE 1 TABLET BY MOUTH DAILY  . fluticasone (FLONASE) 50 MCG/ACT nasal spray Place 2 sprays into both nostrils daily.  Marland Kitchen lactobacillus acidophilus (BACID) TABS Take 1 tablet by mouth daily as needed.   . Magnesium 250 MG TABS Take by mouth.  . Misc Natural Products (DAILY HERBS ENERGY PO) Take 1 capsule by mouth.   No current facility-administered medications on file prior to visit.    Review of Systems Per HPI unless specifically indicated above     Objective:    BP 130/84 mmHg  Pulse 82  Temp(Src) 98 F (36.7 C) (Oral)  Wt 160 lb 8 oz (72.802 kg)  SpO2 96%  Wt Readings from Last 3 Encounters:  07/23/14 160 lb 8 oz (72.802 kg)  06/22/14 158 lb 12.8 oz (72.031 kg)  06/16/14 159 lb (72.122 kg)    Physical Exam  Constitutional: She appears well-developed and well-nourished. No distress.  HENT:  Head: Normocephalic and atraumatic.  Right Ear: Hearing, tympanic membrane, external ear and ear canal normal.  Left Ear: Hearing, tympanic membrane, external ear and ear canal normal.  Nose: Mucosal edema (R>L turbinate swelling) and rhinorrhea present. Right sinus exhibits no maxillary sinus tenderness and no frontal sinus tenderness. Left sinus exhibits no maxillary sinus tenderness and no frontal sinus tenderness.  Mouth/Throat: Uvula is midline and mucous membranes are normal. Posterior oropharyngeal erythema present. No oropharyngeal exudate, posterior oropharyngeal edema or tonsillar abscesses.  Eyes: Conjunctivae and EOM are normal. Pupils are equal, round, and reactive to light. No scleral icterus.  Neck: Normal range of motion. Neck supple.  Cardiovascular: Normal rate, regular rhythm, normal heart sounds and intact distal pulses.   No murmur heard. Pulmonary/Chest: Effort normal and  breath sounds normal. No respiratory distress. She has no wheezes. She has no rales.  Nagging cough present  Lymphadenopathy:    She has no cervical adenopathy.  Skin: Skin is warm and dry. No rash noted.  Nursing note and vitals reviewed.  CT CHEST WITH CONTRAST TECHNIQUE: Multidetector CT imaging of the chest was performed during intravenous contrast administration. CONTRAST: 80 cc Omnipaque COMPARISON: Chest x-ray 06/22/2014 and CT of the chest 11/08/2007 FINDINGS: Sagittal images of the spine shows mild degenerative changes thoracic spine. Sagittal view of the sternum is unremarkable. Mild atherosclerotic calcifications of thoracic aorta. Mild hepatic fatty infiltration. No adrenal gland mass is noted in visualized upper abdomen. There is no mediastinal hematoma or adenopathy. No hilar adenopathy. No aortic aneurysm. Images of the lung parenchyma shows no acute infiltrate or pulmonary edema. Stable fibrotic changes in lingula anteriorly see axial image 40. Minimal fibrotic changes in right middle lobe medially are stable. There is some peripheral thickening of interlobular septa and some pleural thickening in right upper lobe anteriorly see axial image 20. Tiny pleural based nodule in right lower lobe posteriorly measures 3.5 mm stable in size in appearance from prior exam. No other parenchymal nodules are noted. There is no pneumothorax. No pleural effusion. Tiny 3.5 mm pleural-based nodule in left lower lobe posteriorly axial image 42 is stable in size in appearance from prior exam.  IMPRESSION: 1. No parenchymal nodule is noted in lingula. Stable peripheral anterior scarring in lingula. Stable minimal scarring in right middle lobe medially. 2. Stable 3.5 mm pleural based nodule in right lower lobe and left lower lobe. No followup is necessary as stable from 2009. 3. No acute infiltrate or pulmonary edema. There is minimal pleural thickening and some peripheral interstitial scarring  in right upper lobe anterolaterally. 4. Mild hepatic fatty infiltration. 5. No mediastinal hematoma or adenopathy. No hilar adenopathy. No destructive bony lesions are noted. Electronically Signed  By: Lahoma Crocker M.D.  On: 06/24/2014 09:05    Assessment & Plan:   Problem List Items Addressed This Visit    Acute sinusitis - Primary    Reviewed recent CT scan. sxs most consistent with viral sinusitis, discussed this.  However given recent abx use and recent recurrent infections, will provide with WASP for augmentin to fill if worsening over weekend.  Continue ibuprofen, mucinex, discussed echinacea and vitamin C for prevention. Update if not improving with treatment plan. Pt agrees with plan. If persistent cough, would consider PPD.      Relevant Medications   amoxicillin-clavulanate (AUGMENTIN) 875-125 MG per tablet       Follow up plan: Return if symptoms worsen or fail to improve.

## 2014-07-23 NOTE — Patient Instructions (Signed)
You have a sinus infection, possibly still viral. Augmentin antibiotic printed today to take if not improving as expected with time Push fluids and plenty of rest. May use plain mucinex with plenty of fluid to help mobilize mucous. Let us know if not improving with treatment plan.

## 2014-07-23 NOTE — Assessment & Plan Note (Addendum)
Reviewed recent CT scan. sxs most consistent with viral sinusitis, discussed this.  However given recent abx use and recent recurrent infections, will provide with WASP for augmentin to fill if worsening over weekend.  Continue ibuprofen, mucinex, discussed echinacea and vitamin C for prevention. Update if not improving with treatment plan. Pt agrees with plan. If persistent cough, would consider PPD.

## 2014-07-23 NOTE — Progress Notes (Signed)
Pre visit review using our clinic review tool, if applicable. No additional management support is needed unless otherwise documented below in the visit note. 

## 2014-07-24 ENCOUNTER — Telehealth: Payer: Self-pay | Admitting: Family Medicine

## 2014-07-24 DIAGNOSIS — C801 Malignant (primary) neoplasm, unspecified: Secondary | ICD-10-CM

## 2014-07-24 NOTE — Telephone Encounter (Signed)
Orders placed.

## 2014-07-24 NOTE — Telephone Encounter (Signed)
For ms [facility] diagnostic  Mammogram [facility] wants US breast ltd uni right and US breast ltd uni left  Need order before i can schedule thanks

## 2014-07-24 NOTE — Telephone Encounter (Signed)
[facility] wanted you to change diagnostic mammogram to say  Diagnostic uni left

## 2014-07-28 NOTE — Telephone Encounter (Signed)
Hey Dr. Deborra Medina, The recommendation from 03/03/14 mammogram suggests a 6 mo follow up Right Diagnostic Mammogram. Can you please enter that order in and cancel the previous diagnostic bilateral mammogram orders.  Please disregard Virl Cagey request on May 20 because the patient is only due for Diag R breast MM with R breast ultrasound.  Thanks,  Ebony Hail

## 2014-07-28 NOTE — Addendum Note (Signed)
Addended by: Lucille Passy on: 07/28/2014 04:27 PM   Modules accepted: Orders

## 2014-07-28 NOTE — Telephone Encounter (Signed)
Order placed as requested.

## 2014-08-11 ENCOUNTER — Ambulatory Visit: Payer: 59

## 2014-08-11 ENCOUNTER — Other Ambulatory Visit: Payer: 59

## 2014-09-03 ENCOUNTER — Ambulatory Visit: Payer: 59

## 2014-09-03 ENCOUNTER — Other Ambulatory Visit: Payer: Self-pay | Admitting: Family Medicine

## 2014-09-03 ENCOUNTER — Ambulatory Visit
Admission: RE | Admit: 2014-09-03 | Discharge: 2014-09-03 | Disposition: A | Discharge status: Home / Self Care | Payer: 59 | Source: Ambulatory Visit | Attending: Family Medicine | Admitting: Family Medicine

## 2014-09-03 DIAGNOSIS — C801 Malignant (primary) neoplasm, unspecified: Secondary | ICD-10-CM

## 2014-09-03 DIAGNOSIS — Z853 Personal history of malignant neoplasm of breast: Secondary | ICD-10-CM | POA: Diagnosis present

## 2014-09-03 DIAGNOSIS — Z09 Encounter for follow-up examination after completed treatment for conditions other than malignant neoplasm: Secondary | ICD-10-CM | POA: Insufficient documentation

## 2014-09-03 DIAGNOSIS — R928 Other abnormal and inconclusive findings on diagnostic imaging of breast: Secondary | ICD-10-CM | POA: Diagnosis present

## 2014-09-03 HISTORY — DX: Malignant neoplasm of unspecified site of unspecified female breast: C50.919

## 2014-09-11 ENCOUNTER — Other Ambulatory Visit: Payer: Self-pay | Admitting: *Deleted

## 2014-09-11 DIAGNOSIS — C50919 Malignant neoplasm of unspecified site of unspecified female breast: Secondary | ICD-10-CM

## 2014-09-14 ENCOUNTER — Ambulatory Visit (INDEPENDENT_AMBULATORY_CARE_PROVIDER_SITE_OTHER): Payer: 59 | Admitting: Family Medicine

## 2014-09-14 ENCOUNTER — Encounter: Payer: Self-pay | Admitting: Family Medicine

## 2014-09-14 VITALS — BP 138/74 | HR 95 | Temp 98.6°F | Ht 64.0 in | Wt 161.5 lb

## 2014-09-14 DIAGNOSIS — M79674 Pain in right toe(s): Secondary | ICD-10-CM | POA: Diagnosis not present

## 2014-09-14 NOTE — Progress Notes (Signed)
Pre visit review using our clinic review tool, if applicable. No additional management support is needed unless otherwise documented below in the visit note. 

## 2014-09-14 NOTE — Progress Notes (Signed)
Dr. Frederico Hamman T. Berry Godsey, MD, [facility] [place], [address] Phone: [phone] Fax: [phone]  09/14/2014  Patient: Anna Randolph, MRN: [id number], DOB: 04-22-1950, 64 y.o.  Primary Physician:  Arnette Norris, MD  Chief Complaint: Foot Injury  Subjective:   Anna Randolph is a 64 y.o. very pleasant female patient who presents with the following:  Pleasant lady who injured her toe yesterday presents for swelling in bruising on the second toe on the right. She is previously fractured this before. Right now she is not using anything to help protect the toe.  Past Medical History, Surgical History, Social History, Family History, Problem List, Medications, and Allergies have been reviewed and updated if relevant.  Patient Active Problem List   Diagnosis Date Noted  . Acute sinusitis 07/23/2014  . GERD (gastroesophageal reflux disease) 03/09/2014  . Abnormal mammogram 03/09/2014  . Well woman exam 02/20/2014  . Personal history of breast cancer 09/09/2012  . Cancer   . PULMONARY NODULE, LEFT LOWER LOBE 02/12/2008  . HLD (hyperlipidemia) 02/10/2008  . ALLERGIC RHINITIS 02/10/2008    Past Medical History  Diagnosis Date  . Personal history of tobacco use, presenting hazards to health   . Personal history of malignant neoplasm of breast   . Mammographic microcalcification 2013  . Special screening for malignant neoplasms, colon   . 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 Malignant neoplasm of upper-outer quadrant of female breast   . Meningitis as infant  . Shingles 2014  . Thoracic aorta atherosclerosis 2016    by CT  . Fatty liver 2016    by CT  . Breast cancer 2010    right breast lumpectomy with chemo and rad tx    Past Surgical History  Procedure Laterality Date  . Colonoscopy  9417,4081    Dr. Vira Agar  . Portacath placement  2010    placement  . Breast  lumpectomy Right 2010    sentinel node bx  . Abdominal hysterectomy  1995  . Tonsillectomy  1960  . Tubal ligation    . Port-a-cath removal  04/2010  . Breast biopsy Right Jan 2014    stereo negative    History   Social History  . Marital Status: Married    Spouse Name: N/A  . Number of Children: N/A  . Years of Education: N/A   Occupational History  . Not on file.   Social History Main Topics  . Smoking status: Former Smoker -- 18 years  . Smokeless tobacco: Never Used     Comment: quit 1990  . Alcohol Use: 0.0 oz/week    5-6 Glasses of wine per week     Comment: occ  . Drug Use: No  . Sexual Activity: Not on file   Other Topics Concern  . Not on file   Social History Narrative    Family History  Problem Relation Age of Onset  . Leukemia Brother     Allergies  Allergen Reactions  . Sulfa Antibiotics Anaphylaxis    Throat Swells  . Sulfonamide Derivatives Anaphylaxis    Throat Swells  . Lipitor [Atorvastatin] Other (See Comments)    ? Passed out  . Latex Rash  . Tape Rash    Adhesive bandages    Medication list reviewed and updated in full in [facility].  GEN: No fevers, chills. Nontoxic. Primarily MSK c/o today. MSK: Detailed  in the HPI GI: tolerating PO intake without difficulty Neuro: No numbness, parasthesias, or tingling associated. Otherwise the pertinent positives of the ROS are noted above.   Objective:   BP 138/74 mmHg  Pulse 95  Temp(Src) 98.6 F (37 C) (Oral)  Ht 5\' 4"  (1.626 m)  Wt 161 lb 8 oz (73.256 kg)  BMI 27.71 kg/m2   GEN: WDWN, NAD, Non-toxic, Alert & Oriented x 3 HEENT: Atraumatic, Normocephalic.  Ears and Nose: No external deformity. EXTR: No clubbing/cyanosis/edema NEURO: Normal gait.  PSYCH: Normally interactive. Conversant. Not depressed or anxious appearing.  Calm demeanor.    There is some bruising on the second toe, more distally compared to proximally. There is tenderness throughout most of the second  digit on the right, and this is more distal greater than proximal.  No tender throughout all metacarpals. All of the remaining toes are nontender. The remaining mid foot and ankle are nontender.  Radiology:  Assessment and Plan:   Pain of toe of right foot  Probable second toe fracture. Reassured the patient and buddy taped.  She could use a postoperative shoe with she has difficulty.  Signed,  Maud Deed. Torrell Krutz, MD   Patient's Medications  New Prescriptions   No medications on file  Previous Medications   ALBUTEROL (PROVENTIL HFA;VENTOLIN HFA) 108 (90 BASE) MCG/ACT INHALER    Inhale 2 puffs into the lungs every 6 (six) hours as needed for wheezing or shortness of breath.   ASPIRIN 81 MG TABLET    Take 81 mg by mouth daily.   CHOLECALCIFEROL (VITAMIN D3) 2000 UNITS TABS    Take 1 Units by mouth.   COENZYME Q10 (CO Q 10) 10 MG CAPS    Take 1 tablet by mouth daily.   CRESTOR 10 MG TABLET    TAKE 1 TABLET BY MOUTH DAILY   FLUTICASONE (FLONASE) 50 MCG/ACT NASAL SPRAY    Place 2 sprays into both nostrils daily.   LACTOBACILLUS ACIDOPHILUS (BACID) TABS    Take 1 tablet by mouth daily as needed.    MAGNESIUM 250 MG TABS    Take by mouth.   MISC NATURAL PRODUCTS (DAILY HERBS ENERGY PO)    Take 1 capsule by mouth.  Modified Medications   No medications on file  Discontinued Medications   No medications on file

## 2014-09-15 ENCOUNTER — Inpatient Hospital Stay: Payer: 59 | Attending: Oncology | Admitting: Oncology

## 2014-09-15 ENCOUNTER — Encounter: Payer: Self-pay | Admitting: Oncology

## 2014-09-15 ENCOUNTER — Inpatient Hospital Stay: Payer: 59

## 2014-09-15 VITALS — BP 144/84 | HR 83 | Temp 96.8°F | Wt 161.8 lb

## 2014-09-15 DIAGNOSIS — C50911 Malignant neoplasm of unspecified site of right female breast: Secondary | ICD-10-CM

## 2014-09-15 DIAGNOSIS — C50919 Malignant neoplasm of unspecified site of unspecified female breast: Secondary | ICD-10-CM

## 2014-09-15 DIAGNOSIS — Z7982 Long term (current) use of aspirin: Secondary | ICD-10-CM | POA: Diagnosis not present

## 2014-09-15 DIAGNOSIS — K760 Fatty (change of) liver, not elsewhere classified: Secondary | ICD-10-CM | POA: Insufficient documentation

## 2014-09-15 DIAGNOSIS — Z9221 Personal history of antineoplastic chemotherapy: Secondary | ICD-10-CM

## 2014-09-15 DIAGNOSIS — Z79899 Other long term (current) drug therapy: Secondary | ICD-10-CM | POA: Insufficient documentation

## 2014-09-15 DIAGNOSIS — Z853 Personal history of malignant neoplasm of breast: Secondary | ICD-10-CM | POA: Insufficient documentation

## 2014-09-15 DIAGNOSIS — Z923 Personal history of irradiation: Secondary | ICD-10-CM | POA: Insufficient documentation

## 2014-09-15 DIAGNOSIS — F17210 Nicotine dependence, cigarettes, uncomplicated: Secondary | ICD-10-CM | POA: Diagnosis not present

## 2014-09-15 DIAGNOSIS — Z87891 Personal history of nicotine dependence: Secondary | ICD-10-CM | POA: Diagnosis not present

## 2014-09-15 DIAGNOSIS — Z171 Estrogen receptor negative status [ER-]: Secondary | ICD-10-CM

## 2014-09-15 LAB — CBC WITH DIFFERENTIAL/PLATELET
BASOS PCT: 1 %
Basophils Absolute: 0 10*3/uL (ref 0–0.1)
Eosinophils Absolute: 0.1 10*3/uL (ref 0–0.7)
Eosinophils Relative: 2 %
HCT: 43.9 % (ref 35.0–47.0)
Hemoglobin: 14.5 g/dL (ref 12.0–16.0)
Lymphocytes Relative: 26 %
Lymphs Abs: 1.2 10*3/uL (ref 1.0–3.6)
MCH: 30.8 pg (ref 26.0–34.0)
MCHC: 33 g/dL (ref 32.0–36.0)
MCV: 93.5 fL (ref 80.0–100.0)
Monocytes Absolute: 0.5 10*3/uL (ref 0.2–0.9)
Monocytes Relative: 11 %
Neutro Abs: 2.7 10*3/uL (ref 1.4–6.5)
Neutrophils Relative %: 60 %
Platelets: 227 10*3/uL (ref 150–440)
RBC: 4.69 MIL/uL (ref 3.80–5.20)
RDW: 13.3 % (ref 11.5–14.5)
WBC: 4.4 10*3/uL (ref 3.6–11.0)

## 2014-09-15 LAB — COMPREHENSIVE METABOLIC PANEL
ALBUMIN: 4.5 g/dL (ref 3.5–5.0)
ALT: 25 U/L (ref 14–54)
ANION GAP: 6 (ref 5–15)
AST: 31 U/L (ref 15–41)
Alkaline Phosphatase: 77 U/L (ref 38–126)
BILIRUBIN TOTAL: 0.7 mg/dL (ref 0.3–1.2)
BUN: 11 mg/dL (ref 6–20)
CO2: 27 mmol/L (ref 22–32)
Calcium: 8.9 mg/dL (ref 8.9–10.3)
Chloride: 102 mmol/L (ref 101–111)
Creatinine, Ser: 0.72 mg/dL (ref 0.44–1.00)
GFR calc non Af Amer: >60 mL/min (ref >60)
GLUCOSE: 88 mg/dL (ref 65–99)
POTASSIUM: 4 mmol/L (ref 3.5–5.1)
Sodium: 135 mmol/L (ref 135–145)
Total Protein: 7.6 g/dL (ref 6.5–8.1)

## 2014-09-15 NOTE — Progress Notes (Signed)
Patient does have living will.  Former smoker. 

## 2014-09-16 ENCOUNTER — Encounter: Payer: Self-pay | Admitting: Oncology

## 2014-09-16 DIAGNOSIS — C50911 Malignant neoplasm of unspecified site of right female breast: Secondary | ICD-10-CM

## 2014-09-16 DIAGNOSIS — Z171 Estrogen receptor negative status [ER-]: Secondary | ICD-10-CM

## 2014-09-16 HISTORY — DX: Malignant neoplasm of unspecified site of right female breast: C50.911

## 2014-09-16 HISTORY — DX: Estrogen receptor negative status (ER-): Z17.1

## 2014-09-16 NOTE — Progress Notes (Signed)
Anna Randolph @ [facility] [phone]  [phone]) [phone]     Anna Randolph OB: Jul 27, 1950  MR#: [id number]  DZH#:[id number]  Patient Care Team: Lucille Passy, MD as PCP - General (Family Medicine) Seeplaputhur Robinette Haines, MD (General Surgery)  CHIEF COMPLAINT:  Chief Complaint  Patient presents with  . Follow-up    Oncology History   Chief Complaint/Diagnosis:   53. 64 year old female with a stage I pathologic (T1 C. 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 Status post wide local excision and sentinel node biopsy.  Triple negative disease. 2. Status post chemotherapy with Cytoxan/adriamycin  followed by Taxol and radiation therapy.     Cancer of right breast, stage 1, estrogen receptor negative    Oncology Flowsheet 12/12/2012  dexamethasone (DECADRON) IV 10 mg    INTERVAL HISTORY:  64 year old lady with with carcinoma breast triple negative disease.  Came today further follow-up.  Now is more than 5 years since initial diagnosis.  Patient is getting regular mammogram done by primary care physician. No bony pains.  Appetite has remained stable.  REVIEW OF SYSTEMS:   GENERAL:  Feels good.  Active.  No fevers, sweats or weight loss. PERFORMANCE STATUS (ECOG):  01 HEENT:  No visual changes, runny nose, sore throat, mouth sores or tenderness. Lungs: No shortness of breath or cough.  No hemoptysis. Cardiac:  No chest pain, palpitations, orthopnea, or PND. GI:  No nausea, vomiting, diarrhea, constipation, melena or hematochezia. GU:  No urgency, frequency, dysuria, or hematuria. Musculoskeletal:  No back pain.  No joint pain.  No muscle tenderness. Extremities:  No pain or swelling. Skin:  No rashes or skin changes. Neuro:  No headache, numbness or weakness, balance or coordination issues. Endocrine:  No diabetes, thyroid issues, hot flashes or night sweats. Psych:  No mood changes, depression or anxiety. Pain:  No focal pain. Review  of systems:  All other systems reviewed and found to be negative. As per HPI. Otherwise, a complete review of systems is negatve.  PAST MEDICAL HISTORY: Past Medical History  Diagnosis Date  . Personal history of tobacco use, presenting hazards to health   . Personal history of malignant neoplasm of breast   . Mammographic microcalcification 2013  . Special screening for malignant neoplasms, colon   . 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 Malignant neoplasm of upper-outer quadrant of female breast   . Meningitis as infant  . Shingles 2014  . Thoracic aorta atherosclerosis 2016    by CT  . Fatty liver 2016    by CT  . Breast cancer 2010    right breast lumpectomy with chemo and rad tx  . Cancer of right breast, stage 1, estrogen receptor negative 09/16/2014    PAST SURGICAL HISTORY: Past Surgical History  Procedure Laterality Date  . Colonoscopy  [id number]    Dr. Vira Agar  . Portacath placement  2010    placement  . Breast lumpectomy Right 2010    sentinel node bx  . Abdominal hysterectomy  1995  . Tonsillectomy  1960  . Tubal ligation    . Port-a-cath removal  04/2010  . Breast biopsy Right Jan 2014    stereo negative    FAMILY HISTORY Family History  Problem Relation Age of Onset  . Leukemia Brother     ADVANCED DIRECTIVES: Patient does have advance healthcare directive, Patient   does not desire to make any changes  HEALTH MAINTENANCE:  History  Substance Use Topics  . Smoking status: Former Smoker -- 18 years  . Smokeless tobacco: Never Used     Comment: quit 1990  . Alcohol Use: 0.0 oz/week    5-6 Glasses of wine per week     Comment: occ      Allergies  Allergen Reactions  . Sulfa Antibiotics Anaphylaxis    Throat Swells  . Sulfonamide Derivatives Anaphylaxis    Throat Swells  . Lipitor [Atorvastatin] Other (See Comments)    ? Passed out  . Latex Rash  . Tape Rash    Adhesive bandages    Current  Outpatient Prescriptions  Medication Sig Dispense Refill  . albuterol (PROVENTIL HFA;VENTOLIN HFA) 108 (90 BASE) MCG/ACT inhaler Inhale 2 puffs into the lungs every 6 (six) hours as needed for wheezing or shortness of breath. 1 Inhaler 0  . aspirin 81 MG tablet Take 81 mg by mouth daily.    . Cholecalciferol (VITAMIN D3) 2000 UNITS TABS Take 1 Units by mouth.    . Coenzyme Q10 (CO Q 10) 10 MG CAPS Take 1 tablet by mouth daily.    . CRESTOR 10 MG tablet TAKE 1 TABLET BY MOUTH DAILY 30 tablet 5  . fluticasone (FLONASE) 50 MCG/ACT nasal spray Place 2 sprays into both nostrils daily. 16 g 6  . lactobacillus acidophilus (BACID) TABS Take 1 tablet by mouth daily as needed.     . Magnesium 250 MG TABS Take by mouth.    . Misc Natural Products (DAILY HERBS ENERGY PO) Take 1 capsule by mouth.     No current facility-administered medications for this visit.    OBJECTIVE:  Filed Vitals:   09/15/14 1116  BP: 144/84  Pulse: 83  Temp: 96.8 F (36 C)     Body mass index is 27.76 kg/(m^2).    ECOG FS:0 - Asymptomatic  PHYSICAL EXAM: GENERAL:  Well developed, well nourished, sitting comfortably in the exam room in no acute distress. MENTAL STATUS:  Alert and oriented to person, place and time. HEAD:   Normocephalic, atraumatic, face symmetric, no Cushingoid features.   RESPIRATORY:  Clear to auscultation without rales, wheezes or rhonchi. CARDIOVASCULAR:  Regular rate and rhythm without murmur, rub or gallop. BREAST:  Right breast without masses, previous  Lumpectomy shows scar tissue.  Left breast without masses, skin changes or nipple discharge. ABDOMEN:  Soft, non-tender, with active bowel sounds, and no hepatosplenomegaly.  No masses. BACK:  No CVA tenderness.  No tenderness on percussion of the back or rib cage. SKIN:  No rashes, ulcers or lesions. EXTREMITIES: No edema, no skin discoloration or tenderness.  No palpable cords. LYMPH NODES: No palpable cervical, supraclavicular, axillary or  inguinal adenopathy  NEUROLOGICAL: Unremarkable. PSYCH:  Appropriate.   LAB RESULTS:  Appointment on 09/15/2014  Component Date Value Ref Range Status  . WBC 09/15/2014 4.4  3.6 - 11.0 K/uL Final  . RBC 09/15/2014 4.69  3.80 - 5.20 MIL/uL Final  . Hemoglobin 09/15/2014 14.5  12.0 - 16.0 g/dL Final  . HCT 09/15/2014 43.9  35.0 - 47.0 % Final  . MCV 09/15/2014 93.5  80.0 - 100.0 fL Final  . MCH 09/15/2014 30.8  26.0 - 34.0 pg Final  . MCHC 09/15/2014 33.0  32.0 - 36.0 g/dL Final  . RDW 09/15/2014 13.3  11.5 - 14.5 % Final  . Platelets 09/15/2014 227  150 - 440 K/uL Final  . Neutrophils Relative % 09/15/2014 60   Final  . Neutro Abs 09/15/2014  2.7  1.4 - 6.5 K/uL Final  . Lymphocytes Relative 09/15/2014 26   Final  . Lymphs Abs 09/15/2014 1.2  1.0 - 3.6 K/uL Final  . Monocytes Relative 09/15/2014 11   Final  . Monocytes Absolute 09/15/2014 0.5  0.2 - 0.9 K/uL Final  . Eosinophils Relative 09/15/2014 2   Final  . Eosinophils Absolute 09/15/2014 0.1  0 - 0.7 K/uL Final  . Basophils Relative 09/15/2014 1   Final  . Basophils Absolute 09/15/2014 0.0  0 - 0.1 K/uL Final  . Sodium 09/15/2014 135  135 - 145 mmol/L Final  . Potassium 09/15/2014 4.0  3.5 - 5.1 mmol/L Final  . Chloride 09/15/2014 102  101 - 111 mmol/L Final  . CO2 09/15/2014 27  22 - 32 mmol/L Final  . Glucose, Bld 09/15/2014 88  65 - 99 mg/dL Final  . BUN 09/15/2014 11  6 - 20 mg/dL Final  . Creatinine, Ser 09/15/2014 0.72  0.44 - 1.00 mg/dL Final  . Calcium 09/15/2014 8.9  8.9 - 10.3 mg/dL Final  . Total Protein 09/15/2014 7.6  6.5 - 8.1 g/dL Final  . Albumin 09/15/2014 4.5  3.5 - 5.0 g/dL Final  . AST 09/15/2014 31  15 - 41 U/L Final  . ALT 09/15/2014 25  14 - 54 U/L Final  . Alkaline Phosphatase 09/15/2014 77  38 - 126 U/L Final  . Total Bilirubin 09/15/2014 0.7  0.3 - 1.2 mg/dL Final  . GFR calc non Af Amer 09/15/2014 >60  >60 mL/min Final  . GFR calc Af Amer 09/15/2014 >60  >60 mL/min Final   Comment: (NOTE) The  eGFR has been calculated using the CKD EPI equation. This calculation has not been validated in all clinical situations. eGFR's persistently <60 mL/min signify possible Chronic Kidney Disease.   . Anion gap 09/15/2014 6  5 - 15 Final    STUDIES: Mm Diag Breast Tomo Uni Right  09/03/2014   CLINICAL DATA:  Six month followup of probably benign calcifications in the right breast. The patient had a previous of biopsy of calcifications in the superior right breast in 2013, with benign results.  The patient has a history of right breast cancer, diagnosed in 2010.  EXAM: DIGITAL DIAGNOSTIC RIGHT MAMMOGRAM WITH 3D TOMOSYNTHESIS AND CAD  COMPARISON:  With priors  ACR Breast Density Category c: The breast tissue is heterogeneously dense, which may obscure small masses.  FINDINGS: No mass or nonsurgical distortion is identified in the right breast. Magnification views of the upper central right breast show a stable group of rounded punctate calcifications. In total, these calcifications span approximately 8 mm. Biopsy clip is present in the upper slightly outer right breast at the site of prior stereotactic biopsy. Lumpectomy scarring in the posterior third of the upper outer right breast is stable.  Mammographic images were processed with CAD.  IMPRESSION: Stable probably benign calcifications in the right breast.  RECOMMENDATION: Bilateral diagnostic mammogram is recommended in 6 months.  I have discussed the findings and recommendations with the patient. Results were also provided in writing at the conclusion of the visit. If applicable, a reminder letter will be sent to the patient regarding the next appointment.  BI-RADS CATEGORY  3: Probably benign.   Electronically Signed   By: Curlene Dolphin M.D.   On: 09/03/2014 09:21    ASSESSMENT: A syndrome of breast stage I disease triple negative disease.  No evidence of recurrent disease Mammogram shows benign calcification but 6 month follow-up has  been  recommended.  Patient is being followed by primary care physician and they are arranging for mammogram.  MEDICAL DECISION MAKING:  No evidence of recurrent disease repeat bilateral diagnostic mammogram in 6 months  [place]  guidelines for follow-up in breast cancer patient History and physical 1-4 times per year as clinically indicated for first 5 years Periodic screening for changes in the family history and referable to genetic counseling is necessary. Mammographic every 12 months Educated monitor and refer patient for lymphedema management if needed Routine imaging of reconstructed breasts is not indicated In absence of clinical signs and symptoms suggestive of recurrent disease there is no indication for lab or imaging studies for metastatic screening Recommend on tamoxifen needs annual GYN evaluation if uterus is present.  Patient on aromatase inhibitor needs bone density study for evaluation regarding osteoporosis Evidence suggests that active lifestyle healthy diet limited alcohol intake and achieving i and maintaining ideal body weight may lead to optimal breast cancer outcome Patient expressed understanding and was in agreement with this plan. She also understands that She can call clinic at any time with any questions, concerns, or complaints.    No matching staging information was found for the patient.  Forest Gleason, MD   09/16/2014 6:12 PM

## 2014-11-15 ENCOUNTER — Other Ambulatory Visit: Payer: Self-pay | Admitting: Family Medicine

## 2015-02-05 ENCOUNTER — Telehealth: Payer: Self-pay | Admitting: Family Medicine

## 2015-02-05 NOTE — Telephone Encounter (Signed)
Pt notified of Dr. Tower's comments and verbalized understanding  

## 2015-02-05 NOTE — Telephone Encounter (Signed)
Pt called she received letter from [facility] stating it was time for her 6 month diagnostic mammogram Pt wanted to know if she needs to do this every six months her oncology is having her see him once a year now

## 2015-02-05 NOTE — Telephone Encounter (Signed)
Yes- it the radiologist recommends 6 mo - do stick with that  I will cc her PCP as well

## 2015-02-08 ENCOUNTER — Telehealth: Payer: Self-pay | Admitting: Family Medicine

## 2015-02-08 DIAGNOSIS — C50911 Malignant neoplasm of unspecified site of right female breast: Secondary | ICD-10-CM

## 2015-02-08 DIAGNOSIS — Z171 Estrogen receptor negative status [ER-]: Principal | ICD-10-CM

## 2015-02-08 NOTE — Telephone Encounter (Signed)
Pt called to schedule diagnotic mammogram with [place] Needs order for bilateral diagnostic for both breast and limited uni left and right ultrasound   Pt would like to go am no thursday

## 2015-02-08 NOTE — Telephone Encounter (Signed)
Orders placed.

## 2015-03-04 ENCOUNTER — Telehealth: Payer: Self-pay | Admitting: Family Medicine

## 2015-03-04 NOTE — Telephone Encounter (Signed)
Patient Name: Anna Randolph  DOB: Jul 31, 1950    Initial Comment Caller states she is on vacation, has sinus congestion and cough, wants something to help while travelling   Nurse Assessment  Nurse: Raphael Gibney, RN, Vanita Ingles Date/Time ([place] Time): 03/04/2015 11:35:31 AM  Confirm and document reason for call. If symptomatic, describe symptoms. ---Caller states she has sinus congestion and pressure. She had a lot of coughing during the night. Has productive cough with yellow green phlegm. Symptoms started a couple of days ago. Has periods where she has a mild chill. She is on vacation.  Has the patient traveled out of the country within the last 30 days? ---No  Does the patient have any new or worsening symptoms? ---Yes  Will a triage be completed? ---Yes  Related visit to physician within the last 2 weeks? ---No  Does the PT have any chronic conditions? (i.e. diabetes, asthma, etc.) ---No  Is this a behavioral health or substance abuse call? ---No     Guidelines    Guideline Title Affirmed Question Affirmed Notes  Sinus Pain or Congestion [1] Redness or swelling on the cheek, forehead or around the eye AND [2] no fever    Final Disposition User   See Physician within 4 Hours (or PCP triage) Raphael Gibney, RN, Vera    Comments  Pt is out of town in [place]. She is flying home on Saturday. Does not want to go to urgent care but would like an antibiotic called in. The local pharmacy is [facility]: phone: [phone]. Please call pt back and let her know if medication will be called in.  She is allergic to Sulfa   Referrals  GO TO FACILITY REFUSED   Disagree/Comply: Disagree  Disagree/Comply Reason: Disagree with instructions

## 2015-03-04 NOTE — Telephone Encounter (Signed)
We unfortunately do not call in rx for abx without evaluating patients.

## 2015-03-04 NOTE — Telephone Encounter (Signed)
Spoke to pt and advised per Dr Deborra Medina. Pt scheduled OV for 01/03, but will cancel if she begins to feel better.

## 2015-03-09 ENCOUNTER — Ambulatory Visit: Payer: 59 | Admitting: Internal Medicine

## 2015-03-09 ENCOUNTER — Other Ambulatory Visit: Payer: Self-pay | Admitting: Internal Medicine

## 2015-03-09 DIAGNOSIS — Z0000 Encounter for general adult medical examination without abnormal findings: Secondary | ICD-10-CM

## 2015-03-17 ENCOUNTER — Other Ambulatory Visit: Payer: Self-pay | Admitting: Family Medicine

## 2015-03-17 ENCOUNTER — Ambulatory Visit
Admission: RE | Admit: 2015-03-17 | Discharge: 2015-03-17 | Disposition: A | Discharge status: Home / Self Care | Payer: PPO | Source: Ambulatory Visit | Attending: Family Medicine | Admitting: Family Medicine

## 2015-03-17 DIAGNOSIS — C50911 Malignant neoplasm of unspecified site of right female breast: Secondary | ICD-10-CM

## 2015-03-17 DIAGNOSIS — Z171 Estrogen receptor negative status [ER-]: Secondary | ICD-10-CM

## 2015-03-17 DIAGNOSIS — R928 Other abnormal and inconclusive findings on diagnostic imaging of breast: Secondary | ICD-10-CM | POA: Diagnosis not present

## 2015-03-17 DIAGNOSIS — R921 Mammographic calcification found on diagnostic imaging of breast: Secondary | ICD-10-CM | POA: Insufficient documentation

## 2015-03-17 DIAGNOSIS — Z853 Personal history of malignant neoplasm of breast: Secondary | ICD-10-CM | POA: Diagnosis not present

## 2015-03-19 ENCOUNTER — Other Ambulatory Visit (INDEPENDENT_AMBULATORY_CARE_PROVIDER_SITE_OTHER): Payer: PPO

## 2015-03-19 DIAGNOSIS — Z0000 Encounter for general adult medical examination without abnormal findings: Secondary | ICD-10-CM

## 2015-03-19 LAB — COMPREHENSIVE METABOLIC PANEL
ALK PHOS: 82 U/L (ref 39–117)
ALT: 18 U/L (ref 0–35)
AST: 20 U/L (ref 0–37)
Albumin: 4.5 g/dL (ref 3.5–5.2)
BUN: 14 mg/dL (ref 6–23)
CO2: 29 meq/L (ref 19–32)
Calcium: 9.8 mg/dL (ref 8.4–10.5)
Chloride: 104 mEq/L (ref 96–112)
Creatinine, Ser: 0.72 mg/dL (ref 0.40–1.20)
GFR: 86.4 mL/min (ref >60.00)
GLUCOSE: 102 mg/dL — AB (ref 70–99)
POTASSIUM: 4.1 meq/L (ref 3.5–5.1)
SODIUM: 140 meq/L (ref 135–145)
Total Bilirubin: 0.5 mg/dL (ref 0.2–1.2)
Total Protein: 7.5 g/dL (ref 6.0–8.3)

## 2015-03-19 LAB — CBC
HEMATOCRIT: 44.4 % (ref 36.0–46.0)
HEMOGLOBIN: 14.9 g/dL (ref 12.0–15.0)
MCHC: 33.5 g/dL (ref 30.0–36.0)
MCV: 92.3 fl (ref 78.0–100.0)
Platelets: 276 10*3/uL (ref 150.0–400.0)
RBC: 4.81 Mil/uL (ref 3.87–5.11)
RDW: 13.8 % (ref 11.5–15.5)
WBC: 7.6 10*3/uL (ref 4.0–10.5)

## 2015-03-19 LAB — LIPID PANEL
CHOL/HDL RATIO: 3
Cholesterol: 217 mg/dL — ABNORMAL HIGH (ref 0–200)
HDL: 81.3 mg/dL (ref >39.00)
LDL CALC: 109 mg/dL — AB (ref 0–99)
NONHDL: 135.52
Triglycerides: 133 mg/dL (ref 0.0–149.0)
VLDL: 26.6 mg/dL (ref 0.0–40.0)

## 2015-03-19 LAB — TSH: TSH: 4.86 u[IU]/mL — ABNORMAL HIGH (ref 0.35–4.50)

## 2015-03-23 ENCOUNTER — Encounter: Payer: Self-pay | Admitting: Family Medicine

## 2015-03-23 ENCOUNTER — Telehealth: Payer: Self-pay

## 2015-03-23 ENCOUNTER — Ambulatory Visit (INDEPENDENT_AMBULATORY_CARE_PROVIDER_SITE_OTHER): Payer: PPO | Admitting: Family Medicine

## 2015-03-23 VITALS — BP 118/82 | HR 86 | Temp 98.5°F | Ht 64.0 in | Wt 160.8 lb

## 2015-03-23 DIAGNOSIS — Z1159 Encounter for screening for other viral diseases: Secondary | ICD-10-CM | POA: Diagnosis not present

## 2015-03-23 DIAGNOSIS — K219 Gastro-esophageal reflux disease without esophagitis: Secondary | ICD-10-CM | POA: Diagnosis not present

## 2015-03-23 DIAGNOSIS — C50911 Malignant neoplasm of unspecified site of right female breast: Secondary | ICD-10-CM

## 2015-03-23 DIAGNOSIS — E785 Hyperlipidemia, unspecified: Secondary | ICD-10-CM

## 2015-03-23 DIAGNOSIS — Z0000 Encounter for general adult medical examination without abnormal findings: Secondary | ICD-10-CM

## 2015-03-23 DIAGNOSIS — Z1211 Encounter for screening for malignant neoplasm of colon: Secondary | ICD-10-CM

## 2015-03-23 DIAGNOSIS — Z01419 Encounter for gynecological examination (general) (routine) without abnormal findings: Secondary | ICD-10-CM

## 2015-03-23 DIAGNOSIS — Z171 Estrogen receptor negative status [ER-]: Secondary | ICD-10-CM

## 2015-03-23 DIAGNOSIS — Z636 Dependent relative needing care at home: Secondary | ICD-10-CM

## 2015-03-23 NOTE — Progress Notes (Signed)
Subjective:   Patient ID: Anna Randolph, female    DOB: 31-Aug-1950, 65 y.o.   MRN: [id number]  Anna Randolph is a pleasant 65 y.o. year old female who presents to clinic today with Annual Exam  and follow up of chronic medical conditions on 03/23/2015  HPI:  I have personally reviewed the Medicare Annual Wellness questionnaire and have noted 1. The patient's medical and social history 2. Their use of alcohol, tobacco or illicit drugs 3. Their current medications and supplements 4. The patient's functional ability including ADL's, fall risks, home safety risks and hearing or visual             impairment. 5. Diet and physical activities 6. Evidence for depression or mood disorders  End of life wishes discussed and updated in Social History.  The roster of all physicians providing medical care to patient - is listed in the CareTeams section of the chart.  Stressful year.  Unfortunately husband has stage 4 colorectal cancer.  He has not been doing well.  This has been hard on Anna Randolph, but she denies feeling depressed.   She is in inquiring about psychotherapy today.  Remote h/o hysterectomy. Colonoscopy 2011- just got a recall letter. Pneumovax 03/06/2010 Prevnar 13 03/09/14 Mammogram 03/17/15- recommended 1 year follow up.  Zoster 03/25/2010 Tdap 06/04/2004 Influenza 12/25/14  She is a breast CA survivor- s/p lumpectomy, chemo and radiation (2010)- followed by Dr. Jamal Collin and [facility]  UTD on mammograms. Was last seen by Dr. Oliva Bustard on 09/15/14- note reviewed.  HLD- much improved this year. Has been taking Crestor 10 mg daily.  Lab Results  Component Value Date   CHOL 217* 03/19/2015   HDL 81.30 03/19/2015   LDLCALC 109* 03/19/2015   LDLDIRECT 111.0 02/25/2013   TRIG 133.0 03/19/2015   CHOLHDL 3 03/19/2015  ` Lab Results  Component Value Date   CREATININE 0.72 03/19/2015   Lab Results  Component Value Date   NA 140 03/19/2015   K 4.1 03/19/2015   CL 104 03/19/2015     CO2 29 03/19/2015   Lab Results  Component Value Date   WBC 7.6 03/19/2015   HGB 14.9 03/19/2015   HCT 44.4 03/19/2015   MCV 92.3 03/19/2015   PLT 276.0 03/19/2015    Current Outpatient Prescriptions on File Prior to Visit  Medication Sig Dispense Refill  . albuterol (PROVENTIL HFA;VENTOLIN HFA) 108 (90 BASE) MCG/ACT inhaler Inhale 2 puffs into the lungs every 6 (six) hours as needed for wheezing or shortness of breath. 1 Inhaler 0  . aspirin 81 MG tablet Take 81 mg by mouth daily.    . Cholecalciferol (VITAMIN D3) 2000 UNITS TABS Take 1 Units by mouth.    . Coenzyme Q10 (CO Q 10) 10 MG CAPS Take 1 tablet by mouth daily.    . CRESTOR 10 MG tablet TAKE 1 TABLET BY MOUTH DAILY 30 tablet 5  . lactobacillus acidophilus (BACID) TABS Take 1 tablet by mouth daily as needed.     . Magnesium 250 MG TABS Take by mouth.    . Misc Natural Products (DAILY HERBS ENERGY PO) Take 1 capsule by mouth.     No current facility-administered medications on file prior to visit.    Allergies  Allergen Reactions  . Sulfa Antibiotics Anaphylaxis    Throat Swells  . Sulfonamide Derivatives Anaphylaxis    Throat Swells  . Lipitor [Atorvastatin] Other (See Comments)    ? Passed out  . Latex Rash  .  Tape Rash    Adhesive bandages    Past Medical History  Diagnosis Date  . Personal history of tobacco use, presenting hazards to health   . Personal history of malignant neoplasm of breast   . Mammographic microcalcification 2013  . Special screening for malignant neoplasms, colon   . Cancer [facility]) 2010    right lumpectomy with chemo and radiation, Triple negative  . Malignant neoplasm of upper-outer quadrant of female breast ([place])   . Meningitis as infant  . Shingles 2014  . Thoracic aorta atherosclerosis ([place]) 2016    by CT  . Fatty liver 2016    by CT  . Breast cancer ([place]) 2010    right breast lumpectomy with chemo and rad tx  . Cancer of right breast, stage 1, estrogen receptor negative  ([place]) 09/16/2014    Past Surgical History  Procedure Laterality Date  . Colonoscopy  [id number]    Dr. Vira Agar  . Portacath placement  2010    placement  . Breast lumpectomy Right 2010    sentinel node bx  . Abdominal hysterectomy  1995  . Tonsillectomy  1960  . Tubal ligation    . Port-a-cath removal  04/2010  . Breast biopsy Right Jan 2014    stereo negative  . Breast excisional biopsy Right 2010    lumptectomy chemo and rad    Family History  Problem Relation Age of Onset  . Leukemia Brother     Social History   Social History  . Marital Status: Married    Spouse Name: N/A  . Number of Children: N/A  . Years of Education: N/A   Occupational History  . Not on file.   Social History Main Topics  . Smoking status: Former Smoker -- 18 years  . Smokeless tobacco: Never Used     Comment: quit 1990  . Alcohol Use: 0.0 oz/week    5-6 Glasses of wine per week     Comment: occ  . Drug Use: No  . Sexual Activity: Not on file   Other Topics Concern  . Not on file   Social History Narrative   The PMH, PSH, Social History, Family History, Medications, and allergies have been reviewed in [facility], and have been updated if relevant.  Review of Systems  Constitutional: Negative.   HENT: Negative.   Respiratory: Negative.   Cardiovascular: Negative.   Gastrointestinal: Negative for nausea, vomiting, abdominal pain, diarrhea, constipation, blood in stool, abdominal distention and anal bleeding.  Endocrine: Negative.   Genitourinary: Negative.   Musculoskeletal: Negative.   Skin: Negative.   Allergic/Immunologic: Negative.   Neurological: Negative.   Hematological: Negative.   Psychiatric/Behavioral: Negative.        Objective:    Ht 5\' 4"  (1.626 m)  Wt 160 lb 12 oz (72.916 kg)  BMI 27.58 kg/m2   Physical Exam  General:  Well-developed,well-nourished,in no acute distress; alert,appropriate and cooperative throughout examination Head:  normocephalic and  atraumatic.   Eyes:  vision grossly intact, pupils equal, pupils round, and pupils reactive to light.   Ears:  R ear normal and L ear normal.   Nose:  no external deformity.   Mouth:  good dentition.   Neck:  No deformities, masses, or tenderness noted. Breasts:  Deferred- sees oncologist Lungs:  Normal respiratory effort, chest expands symmetrically. Lungs are clear to auscultation, no crackles or wheezes. Heart:  Normal rate and regular rhythm. S1 and S2 normal without gallop, murmur, click, rub or other extra sounds.  Abdomen:  Bowel sounds positive,abdomen soft and non-tender without masses, organomegaly or hernias noted.  Msk:  No deformity or scoliosis noted of thoracic or lumbar spine.   Extremities:  No clubbing, cyanosis, edema, or deformity noted with normal full range of motion of all joints.   Neurologic:  alert & oriented X3 and gait normal.   Skin:  Intact without suspicious lesions or rashes Cervical Nodes:  No lymphadenopathy noted Axillary Nodes:  No palpable lymphadenopathy Psych:  Cognition and judgment appear intact. Alert and cooperative with normal attention span and concentration. No apparent delusions, illusions, hallucinations        Assessment & Plan:   Well woman exam  Cancer of right breast, stage 1, estrogen receptor negative ([place])  HLD (hyperlipidemia)  Gastroesophageal reflux disease, esophagitis presence not specified  Welcome to Medicare preventive visit - Plan: EKG 12-Lead No Follow-up on file.

## 2015-03-23 NOTE — Addendum Note (Signed)
Addended by: Lucille Passy on: 03/23/2015 08:35 AM   Modules accepted: Miquel Dunn

## 2015-03-23 NOTE — Telephone Encounter (Signed)
[place] with me if ok with Anna Randolph.

## 2015-03-23 NOTE — Assessment & Plan Note (Signed)
The patients weight, height, BMI and visual acuity have been recorded in the chart.  Cognitive function assessed.   I have made referrals, counseling and provided education to the patient based review of the above and I have provided the pt with a written personalized care plan for preventive services.  EKG reassuring- NSR.

## 2015-03-23 NOTE — Patient Instructions (Addendum)
Great to see you. Please stop by to see Anna Randolph on your way out.  You can view your results from today online.

## 2015-03-23 NOTE — Assessment & Plan Note (Signed)
UTD mammogram and oncology.

## 2015-03-23 NOTE — Telephone Encounter (Signed)
Lm on pharmacy vm and advised per Dr Aron 

## 2015-03-23 NOTE — Assessment & Plan Note (Signed)
Well controlled on low dose Crestor. No changes made today.

## 2015-03-23 NOTE — Telephone Encounter (Signed)
Amy from [facility] left v/m; pt is requesting to change namebrand crestor to generic. Amy request cb if OK with Dr Deborra Medina to change.

## 2015-03-23 NOTE — Progress Notes (Signed)
Pre visit review using our clinic review tool, if applicable. No additional management support is needed unless otherwise documented below in the visit note. 

## 2015-03-23 NOTE — Assessment & Plan Note (Signed)
Refer to Dr. Rexene Edison or Clint Bolder for psychotherapy.

## 2015-03-24 LAB — HIV ANTIBODY (ROUTINE TESTING W REFLEX): HIV: NONREACTIVE

## 2015-03-24 LAB — HEPATITIS C ANTIBODY: HCV AB: NEGATIVE

## 2015-03-30 ENCOUNTER — Telehealth: Payer: Self-pay | Admitting: Internal Medicine

## 2015-03-30 NOTE — Telephone Encounter (Signed)
Received GI records from [facility] and placed on Dr. Vena Rua desk for review. Patient is requesting to see Dr. Hilarie Fredrickson

## 2015-04-07 DIAGNOSIS — F4323 Adjustment disorder with mixed anxiety and depressed mood: Secondary | ICD-10-CM | POA: Diagnosis not present

## 2015-04-13 ENCOUNTER — Encounter: Payer: Self-pay | Admitting: Internal Medicine

## 2015-04-13 NOTE — Telephone Encounter (Signed)
Left message for patient to return my call. Dr. Hilarie Fredrickson reviewed records and has accepted patient. Ok to schedule Direct Colon.

## 2015-04-20 DIAGNOSIS — F4323 Adjustment disorder with mixed anxiety and depressed mood: Secondary | ICD-10-CM | POA: Diagnosis not present

## 2015-05-19 ENCOUNTER — Ambulatory Visit (AMBULATORY_SURGERY_CENTER): Payer: Self-pay | Admitting: *Deleted

## 2015-05-19 VITALS — Ht 64.0 in | Wt 161.0 lb

## 2015-05-19 DIAGNOSIS — Z86010 Personal history of colonic polyps: Secondary | ICD-10-CM

## 2015-05-19 MED ORDER — NA SULFATE-K SULFATE-MG SULF 17.5-3.13-1.6 GM/177ML PO SOLN: ORAL | Status: DC

## 2015-05-19 NOTE — Progress Notes (Signed)
Patient denies any allergies to eggs or soy. Patient denies any problems with sedation, gets nausea with anesthesia. Patient denies any oxygen use at home and does not take any diet/weight loss medications. Patient declined EMMI education.

## 2015-05-24 ENCOUNTER — Telehealth: Payer: Self-pay | Admitting: Internal Medicine

## 2015-05-24 NOTE — Telephone Encounter (Signed)
Patient advised that I have placed a prep at the front desk for her to pick up. She verbalizes understanding.

## 2015-06-02 ENCOUNTER — Ambulatory Visit (AMBULATORY_SURGERY_CENTER): Payer: PPO | Admitting: Internal Medicine

## 2015-06-02 ENCOUNTER — Encounter: Payer: Self-pay | Admitting: Internal Medicine

## 2015-06-02 VITALS — BP 140/86 | HR 76 | Temp 97.8°F | Resp 12 | Ht 64.0 in | Wt 160.0 lb

## 2015-06-02 DIAGNOSIS — D123 Benign neoplasm of transverse colon: Secondary | ICD-10-CM | POA: Diagnosis not present

## 2015-06-02 DIAGNOSIS — Z86010 Personal history of colonic polyps: Secondary | ICD-10-CM | POA: Diagnosis not present

## 2015-06-02 DIAGNOSIS — D120 Benign neoplasm of cecum: Secondary | ICD-10-CM

## 2015-06-02 MED ORDER — SODIUM CHLORIDE 0.9 % IV SOLN: 500.0000 mL | INTRAVENOUS | Status: DC

## 2015-06-02 NOTE — Patient Instructions (Signed)
YOU HAD AN ENDOSCOPIC PROCEDURE TODAY AT THE [facility]:   Refer to the procedure report that was given to you for any specific questions about what was found during the examination.  If the procedure report does not answer your questions, please call your gastroenterologist to clarify.  If you requested that your care partner not be given the details of your procedure findings, then the procedure report has been included in a sealed envelope for you to review at your convenience later.  YOU SHOULD EXPECT: Some feelings of bloating in the abdomen. Passage of more gas than usual.  Walking can help get rid of the air that was put into your GI tract during the procedure and reduce the bloating. If you had a lower endoscopy (such as a colonoscopy or flexible sigmoidoscopy) you may notice spotting of blood in your stool or on the toilet paper. If you underwent a bowel prep for your procedure, you may not have a normal bowel movement for a few days.  Please Note:  You might notice some irritation and congestion in your nose or some drainage.  This is from the oxygen used during your procedure.  There is no need for concern and it should clear up in a day or so.  SYMPTOMS TO REPORT IMMEDIATELY:   Following lower endoscopy (colonoscopy or flexible sigmoidoscopy):  Excessive amounts of blood in the stool  Significant tenderness or worsening of abdominal pains  Swelling of the abdomen that is new, acute  Fever of 100F or higher   For urgent or emergent issues, a gastroenterologist can be reached at any hour by calling [phone].   DIET: Your first meal following the procedure should be a small meal and then it is ok to progress to your normal diet. Heavy or fried foods are harder to digest and may make you feel nauseous or bloated.  Likewise, meals heavy in dairy and vegetables can increase bloating.  Drink plenty of fluids but you should avoid alcoholic beverages for 24  hours.  ACTIVITY:  You should plan to take it easy for the rest of today and you should NOT DRIVE or use heavy machinery until tomorrow (because of the sedation medicines used during the test).    FOLLOW UP: Our staff will call the number listed on your records the next business day following your procedure to check on you and address any questions or concerns that you may have regarding the information given to you following your procedure. If we do not reach you, we will leave a message.  However, if you are feeling well and you are not experiencing any problems, there is no need to return our call.  We will assume that you have returned to your regular daily activities without incident.  If any biopsies were taken you will be contacted by phone or by letter within the next 1-3 weeks.  Please call us at [phone] if you have not heard about the biopsies in 3 weeks.    SIGNATURES/CONFIDENTIALITY: You and/or your care partner have signed paperwork which will be entered into your electronic medical record.  These signatures attest to the fact that that the information above on your After Visit Summary has been reviewed and is understood.  Full responsibility of the confidentiality of this discharge information lies with you and/or your care-partner.   Resume medications. Information given on polyps,hemorrhoids and high fiber diet. 

## 2015-06-02 NOTE — Op Note (Signed)
[place] Patient Name: Anna Randolph Procedure Date: 06/02/2015 9:27 AM MRN: [id number] Endoscopist: Jerene Bears , MD Age: 65 Referring MD:  Date of Birth: 07-27-1950 Gender: Female Procedure:                Colonoscopy Indications:              High risk colon cancer surveillance: Personal                            history of adenoma less than 10 mm in size, Last                            colonoscopy: December 2011 Medicines:                Monitored Anesthesia Care Procedure:                Pre-Anesthesia Assessment:                           - Prior to the procedure, a History and Physical                            was performed, and patient medications and                            allergies were reviewed. The patient's tolerance of                            previous anesthesia was also reviewed. The risks                            and benefits of the procedure and the sedation                            options and risks were discussed with the patient.                            All questions were answered, and informed consent                            was obtained. Prior Anticoagulants: The patient has                            taken no previous anticoagulant or antiplatelet                            agents. ASA Grade Assessment: II - A patient with                            mild systemic disease. After reviewing the risks                            and benefits, the patient was deemed in  satisfactory condition to undergo the procedure.                           After obtaining informed consent, the colonoscope                            was passed under direct vision. Throughout the                            procedure, the patient's blood pressure, pulse, and                            oxygen saturations were monitored continuously. The                            Model PCF-H190L [id number]) scope was introduced                   through the anus and advanced to the the cecum,                            identified by appendiceal orifice and ileocecal                            valve. The colonoscopy was performed without                            difficulty. The patient tolerated the procedure                            well. The quality of the bowel preparation was                            excellent. The ileocecal valve, appendiceal                            orifice, and rectum were photographed. The bowel                            preparation used was SUPREP. Scope In: 9:46:55 AM Scope Out: 10:03:22 AM Scope Withdrawal Time: 0 hours 11 minutes 21 seconds  Total Procedure Duration: 0 hours 16 minutes 27 seconds  Findings:      The digital rectal exam was normal.      A 4 mm polyp was found in the cecum. The polyp was sessile. The polyp       was removed with a cold snare. Resection and retrieval were complete.      A 5 mm polyp was found in the proximal transverse colon. The polyp was       sessile. The polyp was removed with a cold snare. Resection and       retrieval were complete.      The exam was otherwise normal throughout the examined colon.      Internal hemorrhoids were found during retroflexion. The hemorrhoids       were small. Complications:            No  immediate complications. Estimated Blood Loss:     Estimated blood loss: none. Impression:               - One 4 mm polyp in the cecum, removed with a cold                            snare. Resected and retrieved.                           - One 5 mm polyp in the proximal transverse colon,                            removed with a cold snare. Resected and retrieved.                           - Internal hemorrhoids. Recommendation:           - Patient has a contact number available for                            emergencies. The signs and symptoms of potential                            delayed complications were discussed with  the                            patient. Return to normal activities tomorrow.                            Written discharge instructions were provided to the                            patient.                           - Resume previous diet.                           - Continue present medications.                           - Await pathology results.                           - Repeat colonoscopy is recommended for                            surveillance. The colonoscopy date will be                            determined after pathology results from today's                            exam become available for review. Procedure Code(s):        --- Professional ---  45385, Colonoscopy, flexible; with removal of                            tumor(s), polyp(s), or other lesion(s) by snare                            technique CPT copyright 2016 [employer] Medical Association. All rights reserved. Lajuan Lines. Hilarie Fredrickson, MD Jerene Bears, MD 06/02/2015 10:10:45 AM This report has been signed electronically. Number of Addenda: 0 Referring MD:      Marciano Sequin. Deborra Medina

## 2015-06-02 NOTE — Progress Notes (Signed)
To recovery, repoirt to Doyle Askew, Therapist, sports, VSS

## 2015-06-02 NOTE — Progress Notes (Signed)
Called to room to assist during endoscopic procedure.  Patient ID and intended procedure confirmed with present staff. Received instructions for my participation in the procedure from the performing physician.  

## 2015-06-03 ENCOUNTER — Telehealth: Payer: Self-pay | Admitting: *Deleted

## 2015-06-03 NOTE — Telephone Encounter (Signed)
  Follow up Call-  Call back number 06/02/2015  Post procedure Call Back phone  # [phone]  Permission to leave phone message Yes     Patient questions:  Do you have a fever, pain , or abdominal swelling? No. Pain Score  0 *  Have you tolerated food without any problems? Yes.    Have you been able to return to your normal activities? Yes.    Do you have any questions about your discharge instructions: Diet   No. Medications  No. Follow up visit  No.  Do you have questions or concerns about your Care? No.  Actions: * If pain score is 4 or above: No action needed, pain <4.

## 2015-06-08 ENCOUNTER — Encounter: Payer: Self-pay | Admitting: Internal Medicine

## 2015-06-23 ENCOUNTER — Other Ambulatory Visit: Payer: Self-pay | Admitting: Family Medicine

## 2015-08-24 ENCOUNTER — Other Ambulatory Visit: Payer: Self-pay | Admitting: Family Medicine

## 2015-08-25 NOTE — Telephone Encounter (Signed)
Last labs abnormal. pls advise 

## 2015-09-15 ENCOUNTER — Inpatient Hospital Stay: Payer: PPO | Attending: Family Medicine | Admitting: Family Medicine

## 2015-09-15 ENCOUNTER — Inpatient Hospital Stay: Payer: PPO

## 2015-09-15 VITALS — BP 149/91 | HR 82 | Temp 97.8°F | Wt 160.0 lb

## 2015-09-15 DIAGNOSIS — Z171 Estrogen receptor negative status [ER-]: Secondary | ICD-10-CM

## 2015-09-15 DIAGNOSIS — Z923 Personal history of irradiation: Secondary | ICD-10-CM | POA: Diagnosis not present

## 2015-09-15 DIAGNOSIS — I700 Atherosclerosis of aorta: Secondary | ICD-10-CM | POA: Diagnosis not present

## 2015-09-15 DIAGNOSIS — Z7982 Long term (current) use of aspirin: Secondary | ICD-10-CM | POA: Insufficient documentation

## 2015-09-15 DIAGNOSIS — Z87891 Personal history of nicotine dependence: Secondary | ICD-10-CM | POA: Diagnosis not present

## 2015-09-15 DIAGNOSIS — Z853 Personal history of malignant neoplasm of breast: Secondary | ICD-10-CM

## 2015-09-15 DIAGNOSIS — Z9221 Personal history of antineoplastic chemotherapy: Secondary | ICD-10-CM | POA: Diagnosis not present

## 2015-09-15 DIAGNOSIS — B029 Zoster without complications: Secondary | ICD-10-CM | POA: Diagnosis not present

## 2015-09-15 DIAGNOSIS — Z806 Family history of leukemia: Secondary | ICD-10-CM | POA: Insufficient documentation

## 2015-09-15 DIAGNOSIS — C50911 Malignant neoplasm of unspecified site of right female breast: Secondary | ICD-10-CM

## 2015-09-15 DIAGNOSIS — K760 Fatty (change of) liver, not elsewhere classified: Secondary | ICD-10-CM | POA: Diagnosis not present

## 2015-09-15 LAB — CBC WITH DIFFERENTIAL/PLATELET
BASOS PCT: 1 %
Basophils Absolute: 0 10*3/uL (ref 0–0.1)
EOS ABS: 0.1 10*3/uL (ref 0–0.7)
Eosinophils Relative: 1 %
HEMATOCRIT: 42.9 % (ref 35.0–47.0)
HEMOGLOBIN: 14.8 g/dL (ref 12.0–16.0)
LYMPHS ABS: 1 10*3/uL (ref 1.0–3.6)
Lymphocytes Relative: 19 %
MCH: 31 pg (ref 26.0–34.0)
MCHC: 34.5 g/dL (ref 32.0–36.0)
MCV: 89.8 fL (ref 80.0–100.0)
MONOS PCT: 11 %
Monocytes Absolute: 0.6 10*3/uL (ref 0.2–0.9)
NEUTROS ABS: 3.4 10*3/uL (ref 1.4–6.5)
NEUTROS PCT: 68 %
Platelets: 227 10*3/uL (ref 150–440)
RBC: 4.77 MIL/uL (ref 3.80–5.20)
RDW: 12.9 % (ref 11.5–14.5)
WBC: 5 10*3/uL (ref 3.6–11.0)

## 2015-09-15 LAB — COMPREHENSIVE METABOLIC PANEL
ALK PHOS: 75 U/L (ref 38–126)
ALT: 19 U/L (ref 14–54)
AST: 25 U/L (ref 15–41)
Albumin: 4.5 g/dL (ref 3.5–5.0)
Anion gap: 7 (ref 5–15)
BILIRUBIN TOTAL: 0.7 mg/dL (ref 0.3–1.2)
BUN: 15 mg/dL (ref 6–20)
CALCIUM: 9.5 mg/dL (ref 8.9–10.3)
CO2: 26 mmol/L (ref 22–32)
CREATININE: 0.68 mg/dL (ref 0.44–1.00)
Chloride: 104 mmol/L (ref 101–111)
GFR calc Af Amer: >60 mL/min (ref >60)
GFR calc non Af Amer: >60 mL/min (ref >60)
Glucose, Bld: 99 mg/dL (ref 65–99)
Potassium: 3.9 mmol/L (ref 3.5–5.1)
Sodium: 137 mmol/L (ref 135–145)
TOTAL PROTEIN: 7.8 g/dL (ref 6.5–8.1)

## 2015-09-15 NOTE — Progress Notes (Signed)
[place]  [phone]  [phone]     Anna Randolph DOB: 1950-12-05  MR#: [id number]  MCN#:[id number]  Patient Care Team: Lucille Passy, MD as PCP - General (Family Medicine) Seeplaputhur Robinette Haines, MD (General Surgery) Forest Gleason, MD (Oncology)  CHIEF COMPLAINT:  Chief Complaint  Patient presents with  . Personal history of breast cancer    INTERVAL HISTORY:  Patient is here for further follow-up regarding carcinoma of the right breast, triple negative disease. Patient was originally diagnosed in 2010. Patient overall reports feeling very well. She specifically denies any significant shortness of breath, chest pain, night sweats, fever, chills, new or unusual bone pain. Patient had a mammogram performed in January 2017 that was reported as BI-RADS 2, benign. She reports having some tenderness in the right breast but has not palpated any unusual findings. Patient also recently had a routine colonoscopy performed with [facility], reported as negative.  REVIEW OF SYSTEMS:   Review of Systems  Constitutional: Negative for fever, chills, weight loss, malaise/fatigue and diaphoresis.  HENT: Negative.   Eyes: Negative.   Respiratory: Negative for cough, hemoptysis, sputum production, shortness of breath and wheezing.   Cardiovascular: Negative for chest pain, palpitations, orthopnea, claudication, leg swelling and PND.  Gastrointestinal: Negative for heartburn, nausea, vomiting, abdominal pain, diarrhea, constipation, blood in stool and melena.  Genitourinary: Negative.   Musculoskeletal: Negative.   Skin: Negative.   Neurological: Negative for dizziness, tingling, focal weakness, seizures and weakness.  Endo/Heme/Allergies: Does not bruise/bleed easily.  Psychiatric/Behavioral: Negative for depression. The patient is not nervous/anxious and does not have insomnia.     As per HPI. Otherwise, a complete review of systems is  negatve.  ONCOLOGY HISTORY: Oncology History   Chief Complaint/Diagnosis:   87. 65 year old female with a stage I pathologic (T1 C. 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 Status post wide local excision and sentinel node biopsy.  Triple negative disease. 2. Status post chemotherapy with Cytoxan/adriamycin  followed by Taxol and radiation therapy.     Cancer of right breast, stage 1, estrogen receptor negative ([place])    PAST MEDICAL HISTORY: Past Medical History  Diagnosis Date  . Personal history of tobacco use, presenting hazards to health   . Personal history of malignant neoplasm of breast   . Mammographic microcalcification 2013  . Special screening for malignant neoplasms, colon   . Cancer [facility]) 2010    right lumpectomy with chemo and radiation, Triple negative  . Malignant neoplasm of upper-outer quadrant of female breast ([place])   . Meningitis as infant  . Shingles 2014  . Thoracic aorta atherosclerosis ([place]) 2016    by CT  . Fatty liver 2016    by CT  . Breast cancer ([place]) 2010    right breast lumpectomy with chemo and rad tx  . Cancer of right breast, stage 1, estrogen receptor negative ([place]) 09/16/2014    PAST SURGICAL HISTORY: Past Surgical History  Procedure Laterality Date  . Colonoscopy  [id number]    Dr. Vira Agar  . Portacath placement  2010    placement  . Breast lumpectomy Right 2010    sentinel node bx  . Abdominal hysterectomy  1995  . Tonsillectomy  1960  . Tubal ligation    . Port-a-cath removal  04/2010  . Breast biopsy Right Jan 2014    stereo negative  . Breast excisional biopsy Right 2010    lumptectomy chemo and rad  FAMILY HISTORY Family History  Problem Relation Age of Onset  . Leukemia Brother   . Colon cancer Neg Hx     GYNECOLOGIC HISTORY:  No LMP recorded. Patient has had a hysterectomy.     ADVANCED DIRECTIVES:    HEALTH MAINTENANCE: Social History  Substance Use Topics  . Smoking  status: Former Smoker -- 18 years  . Smokeless tobacco: Never Used     Comment: quit 1990  . Alcohol Use: 0.0 oz/week    5-6 Glasses of wine per week     Comment: occ     Colonoscopy:March 2017  PAP:  Bone density:  Mammogram:January 2017  Allergies  Allergen Reactions  . Sulfa Antibiotics Anaphylaxis    Throat Swells  . Sulfonamide Derivatives Anaphylaxis    Throat Swells  . Lipitor [Atorvastatin] Other (See Comments)    ? Passed out  . Latex Rash  . Tape Rash    Adhesive bandages    Current Outpatient Prescriptions  Medication Sig Dispense Refill  . aspirin 81 MG tablet Take 81 mg by mouth daily.    . Cholecalciferol (VITAMIN D3) 2000 UNITS TABS Take 1 Units by mouth.    . Coenzyme Q10 (CO Q 10) 10 MG CAPS Take 1 tablet by mouth daily.    . Garlic 3235 MG CAPS Take 1 capsule by mouth daily.    Marland Kitchen lactobacillus acidophilus (BACID) TABS Take 1 tablet by mouth daily as needed. Reported on 06/02/2015    . Magnesium 250 MG TABS Take 400 mg by mouth daily.     . rosuvastatin (CRESTOR) 10 MG tablet TAKE 1 TABLET BY MOUTH DAILY 30 tablet 3  . Turmeric 500 MG CAPS Take 1 capsule by mouth daily.     No current facility-administered medications for this visit.    OBJECTIVE: BP 149/91 mmHg  Pulse 82  Temp(Src) 97.8 F (36.6 C) (Tympanic)  Wt 160 lb (72.576 kg)   Body mass index is 27.45 kg/(m^2).    ECOG FS:0 - Asymptomatic  General: Well-developed, well-nourished, no acute distress. Eyes: Pink conjunctiva, anicteric sclera. HEENT: Normocephalic, moist mucous membranes, clear oropharnyx. Lungs: Clear to auscultation bilaterally. Heart: Regular rate and rhythm. No rubs, murmurs, or gallops. Abdomen: Soft, nontender, nondistended. No organomegaly noted, normoactive bowel sounds. Breast: Breast palpated in a circular manner in the sitting and supine positions.  No masses or fullness palpated.  Axilla palpated in both positions with no masses or fullness palpated. Right breast  with minimal scar tissue present in area of lumpectomy. Musculoskeletal: No edema, cyanosis, or clubbing. Neuro: Alert, answering all questions appropriately. Cranial nerves grossly intact. Skin: No rashes or petechiae noted. Psych: Normal affect. Lymphatics: No cervical, clavicular, or axillary LAD.   LAB RESULTS:  Appointment on 09/15/2015  Component Date Value Ref Range Status  . WBC 09/15/2015 5.0  3.6 - 11.0 K/uL Final  . RBC 09/15/2015 4.77  3.80 - 5.20 MIL/uL Final  . Hemoglobin 09/15/2015 14.8  12.0 - 16.0 g/dL Final  . HCT 09/15/2015 42.9  35.0 - 47.0 % Final  . MCV 09/15/2015 89.8  80.0 - 100.0 fL Final  . MCH 09/15/2015 31.0  26.0 - 34.0 pg Final  . MCHC 09/15/2015 34.5  32.0 - 36.0 g/dL Final  . RDW 09/15/2015 12.9  11.5 - 14.5 % Final  . Platelets 09/15/2015 227  150 - 440 K/uL Final  . Neutrophils Relative % 09/15/2015 68   Final  . Neutro Abs 09/15/2015 3.4  1.4 - 6.5 K/uL Final  .  Lymphocytes Relative 09/15/2015 19   Final  . Lymphs Abs 09/15/2015 1.0  1.0 - 3.6 K/uL Final  . Monocytes Relative 09/15/2015 11   Final  . Monocytes Absolute 09/15/2015 0.6  0.2 - 0.9 K/uL Final  . Eosinophils Relative 09/15/2015 1   Final  . Eosinophils Absolute 09/15/2015 0.1  0 - 0.7 K/uL Final  . Basophils Relative 09/15/2015 1   Final  . Basophils Absolute 09/15/2015 0.0  0 - 0.1 K/uL Final  . Sodium 09/15/2015 137  135 - 145 mmol/L Final  . Potassium 09/15/2015 3.9  3.5 - 5.1 mmol/L Final  . Chloride 09/15/2015 104  101 - 111 mmol/L Final  . CO2 09/15/2015 26  22 - 32 mmol/L Final  . Glucose, Bld 09/15/2015 99  65 - 99 mg/dL Final  . BUN 09/15/2015 15  6 - 20 mg/dL Final  . Creatinine, Ser 09/15/2015 0.68  0.44 - 1.00 mg/dL Final  . Calcium 09/15/2015 9.5  8.9 - 10.3 mg/dL Final  . Total Protein 09/15/2015 7.8  6.5 - 8.1 g/dL Final  . Albumin 09/15/2015 4.5  3.5 - 5.0 g/dL Final  . AST 09/15/2015 25  15 - 41 U/L Final  . ALT 09/15/2015 19  14 - 54 U/L Final  . Alkaline  Phosphatase 09/15/2015 75  38 - 126 U/L Final  . Total Bilirubin 09/15/2015 0.7  0.3 - 1.2 mg/dL Final  . GFR calc non Af Amer 09/15/2015 >60  >60 mL/min Final  . GFR calc Af Amer 09/15/2015 >60  >60 mL/min Final   Comment: (NOTE) The eGFR has been calculated using the CKD EPI equation. This calculation has not been validated in all clinical situations. eGFR's persistently <60 mL/min signify possible Chronic Kidney Disease.   . Anion gap 09/15/2015 7  5 - 15 Final    STUDIES: No results found.  ASSESSMENT:  Carcinoma of the right breast, T1 cN0 M0, stage I. Triple negative disease.  PLAN:   1. Right breast cancer. Patient originally diagnosed in 2010 approximately, she is status post wide local excision and sentinel lymph node biopsy which was negative. Patient also underwent adjuvant chemotherapy with Cytoxan and Adriamycin followed by Taxol and radiation therapy. Clinically there is no evidence of recurrent or progressive disease. She continues with yearly mammograms, last was performed in January 2017 and reported as BI-RADS 2, benign. No other imaging is needed.  Patient should continue with routine follow-up with our office in approximately one year as she is now greater than 5 years out from diagnosis and completion of treatment. Encouraged patient to continue with monthly self breast exams.  Patient expressed understanding and was in agreement with this plan. She also understands that She can call clinic at any time with any questions, concerns, or complaints.   Dr. Grayland Ormond was available for consultation and review of plan of care for this patient.  Evlyn Kanner, NP   09/15/2015 3:24 PM

## 2015-10-05 DIAGNOSIS — D485 Neoplasm of uncertain behavior of skin: Secondary | ICD-10-CM | POA: Diagnosis not present

## 2015-10-05 DIAGNOSIS — L821 Other seborrheic keratosis: Secondary | ICD-10-CM | POA: Diagnosis not present

## 2015-10-28 DIAGNOSIS — D485 Neoplasm of uncertain behavior of skin: Secondary | ICD-10-CM | POA: Diagnosis not present

## 2015-11-16 ENCOUNTER — Ambulatory Visit (HOSPITAL_COMMUNITY)
Admission: EM | Admit: 2015-11-16 | Discharge: 2015-11-16 | Disposition: A | Discharge status: Home / Self Care | Payer: PPO | Attending: Family Medicine | Admitting: Family Medicine

## 2015-11-16 ENCOUNTER — Encounter (HOSPITAL_COMMUNITY): Payer: Self-pay | Admitting: Emergency Medicine

## 2015-11-16 DIAGNOSIS — J069 Acute upper respiratory infection, unspecified: Secondary | ICD-10-CM | POA: Diagnosis not present

## 2015-11-16 MED ORDER — IPRATROPIUM BROMIDE 0.06 % NA SOLN: 2.0000 | Freq: Four times a day (QID) | NASAL | 1 refills | Status: DC

## 2015-11-16 MED ORDER — DOXYCYCLINE HYCLATE 100 MG PO CAPS: 100.0000 mg | ORAL_CAPSULE | Freq: Two times a day (BID) | ORAL | 0 refills | Status: DC

## 2015-11-16 NOTE — ED Provider Notes (Signed)
[place]    CSN: [id number] Arrival date & time: 11/16/15  1900  First Provider Contact:  First MD Initiated Contact with Patient 11/16/15 1922        History   Chief Complaint No chief complaint on file.   HPI Anna Randolph is a 65 y.o. female.    URI  Presenting symptoms: congestion, ear pain, rhinorrhea and sore throat   Presenting symptoms: no fever   Severity:  Moderate Onset quality:  Gradual Duration:  1 week Progression:  Worsening Chronicity:  New Relieved by:  None tried Worsened by:  Nothing Ineffective treatments:  None tried Associated symptoms: sinus pain     Past Medical History:  Diagnosis Date  . Breast cancer ([place]) 2010   right breast lumpectomy with chemo and rad tx  . Cancer [facility]) 2010   right lumpectomy with chemo and radiation, Triple negative  . Cancer of right breast, stage 1, estrogen receptor negative ([place]) 09/16/2014  . Fatty liver 2016   by CT  . Malignant neoplasm of upper-outer quadrant of female breast ([place])   . Mammographic microcalcification 2013  . Meningitis as infant  . Personal history of malignant neoplasm of breast   . Personal history of tobacco use, presenting hazards to health   . Shingles 2014  . Special screening for malignant neoplasms, colon   . Thoracic aorta atherosclerosis ([place]) 2016   by CT    Patient Active Problem List   Diagnosis Date Noted  . Caregiver stress 03/23/2015  . Cancer of right breast, stage 1, estrogen receptor negative ([place]) 09/16/2014  . GERD (gastroesophageal reflux disease) 03/09/2014  . Well woman exam 02/20/2014  . Personal history of breast cancer 09/09/2012  . PULMONARY NODULE, LEFT LOWER LOBE 02/12/2008  . HLD (hyperlipidemia) 02/10/2008  . Allergic rhinitis 02/10/2008    Past Surgical History:  Procedure Laterality Date  . ABDOMINAL HYSTERECTOMY  1995  . BREAST BIOPSY Right Jan 2014   stereo negative  . BREAST EXCISIONAL BIOPSY Right 2010   lumptectomy  chemo and rad  . BREAST LUMPECTOMY Right 2010   sentinel node bx  . COLONOSCOPY  [id number]   Dr. Vira Agar  . PORT-A-CATH REMOVAL  04/2010  . PORTACATH PLACEMENT  2010   placement  . TONSILLECTOMY  1960  . TUBAL LIGATION      OB History    Gravida Para Term Preterm AB Living   2 2       2    SAB TAB Ectopic Multiple Live Births                  Obstetric Comments   Age with first menstruation-12 Age with first pregnancy-18 LMP-1995, hysterectomy       Home Medications    Prior to Admission medications   Medication Sig Start Date End Date Taking? Authorizing Provider  aspirin 81 MG tablet Take 81 mg by mouth daily.    Historical Provider, MD  Cholecalciferol (VITAMIN D3) 2000 UNITS TABS Take 1 Units by mouth.    Historical Provider, MD  Coenzyme Q10 (CO Q 10) 10 MG CAPS Take 1 tablet by mouth daily.    Historical Provider, MD  Garlic 123XX123 MG CAPS Take 1 capsule by mouth daily.    Historical Provider, MD  lactobacillus acidophilus (BACID) TABS Take 1 tablet by mouth daily as needed. Reported on 06/02/2015    Historical Provider, MD  Magnesium 250 MG TABS Take 400 mg by mouth daily.  Historical Provider, MD  rosuvastatin (CRESTOR) 10 MG tablet TAKE 1 TABLET BY MOUTH DAILY 08/25/15   Lucille Passy, MD  Turmeric 500 MG CAPS Take 1 capsule by mouth daily.    Historical Provider, MD    Family History Family History  Problem Relation Age of Onset  . Leukemia Brother   . Colon cancer Neg Hx     Social History Social History  Substance Use Topics  . Smoking status: Former Smoker    Years: 18.00  . Smokeless tobacco: Never Used     Comment: quit 1990  . Alcohol use 0.0 oz/week    5 - 6 Glasses of wine per week     Comment: occ     Allergies   Sulfa antibiotics; Sulfonamide derivatives; Lipitor [atorvastatin]; Latex; and Tape   Review of Systems Review of Systems  Constitutional: Negative for fever.  HENT: Positive for congestion, ear pain, postnasal drip,  rhinorrhea, sinus pressure and sore throat.   Respiratory: Negative.   Cardiovascular: Negative.   Neurological: Negative.   All other systems reviewed and are negative.    Physical Exam Triage Vital Signs ED Triage Vitals [11/16/15 1914]  Enc Vitals Group     BP 137/84     Pulse Rate 105     Resp 15     Temp 98.4 F (36.9 C)     Temp Source Oral     SpO2 97 %     Weight      Height      Head Circumference      Peak Flow      Pain Score      Pain Loc      Pain Edu?      Excl. in [place]?    No data found.   Updated Vital Signs BP 137/84 (BP Location: Right Arm)   Pulse 105   Temp 98.4 F (36.9 C) (Oral)   Resp 15   SpO2 97%   Visual Acuity Right Eye Distance:   Left Eye Distance:   Bilateral Distance:    Right Eye Near:   Left Eye Near:    Bilateral Near:     Physical Exam  Constitutional: She is oriented to person, place, and time. She appears well-developed and well-nourished.  HENT:  Right Ear: External ear normal.  Left Ear: External ear normal.  Mouth/Throat: Oropharynx is clear and moist.  Eyes: EOM are normal. Pupils are equal, round, and reactive to light.  Neck: Normal range of motion. Neck supple.  Cardiovascular: Normal rate, regular rhythm, normal heart sounds and intact distal pulses.   Pulmonary/Chest: Effort normal and breath sounds normal.  Lymphadenopathy:    She has no cervical adenopathy.  Neurological: She is alert and oriented to person, place, and time.  Skin: Skin is warm and dry.  Nursing note and vitals reviewed.    UC Treatments / Results  Labs (all labs ordered are listed, but only abnormal results are displayed) Labs Reviewed - No data to display  EKG  EKG Interpretation None       Radiology No results found.  Procedures Procedures (including critical care time)  Medications Ordered in UC Medications - No data to display   Initial Impression / Assessment and Plan / UC Course  I have reviewed the triage  vital signs and the nursing notes.  Pertinent labs & imaging results that were available during my care of the patient were reviewed by me and considered in my medical decision  making (see chart for details).  Clinical Course      Final Clinical Impressions(s) / UC Diagnoses   Final diagnoses:  None    New Prescriptions New Prescriptions   No medications on file     Billy Fischer, MD 11/16/15 1939

## 2015-11-17 ENCOUNTER — Ambulatory Visit: Payer: PPO | Admitting: Family Medicine

## 2015-12-01 ENCOUNTER — Ambulatory Visit (INDEPENDENT_AMBULATORY_CARE_PROVIDER_SITE_OTHER): Payer: PPO | Admitting: Family Medicine

## 2015-12-01 ENCOUNTER — Encounter: Payer: Self-pay | Admitting: Family Medicine

## 2015-12-01 VITALS — BP 114/70 | HR 84 | Temp 98.5°F | Wt 162.0 lb

## 2015-12-01 DIAGNOSIS — R05 Cough: Secondary | ICD-10-CM

## 2015-12-01 DIAGNOSIS — Z23 Encounter for immunization: Secondary | ICD-10-CM | POA: Diagnosis not present

## 2015-12-01 MED ORDER — LEVOCETIRIZINE DIHYDROCHLORIDE 5 MG PO TABS: 5.0000 mg | ORAL_TABLET | Freq: Every evening | ORAL | 3 refills | Status: DC

## 2015-12-01 NOTE — Progress Notes (Signed)
Pre visit review using our clinic review tool, if applicable. No additional management support is needed unless otherwise documented below in the visit note. 

## 2015-12-01 NOTE — Patient Instructions (Signed)
Great to see you. Your lungs are clear today which is great. Let's try xyzal.

## 2015-12-01 NOTE — Progress Notes (Signed)
SUBJECTIVE:  Anna Randolph is a 65 y.o. female who complains of persistent dry cough and nasal congestion.  Went to UC on 9/13.  Diagnosed with bronchitis and given Doxycyline 100 mg twice daily x 10 days. Finished course on 11/27/2015. Feels overall better.  Wheezing has resolved but still has a lot of sinus drainage, congestion and cough.  No fevers No Chills No SOB  Takes claritin daily for seasonal allergies.  Current Outpatient Prescriptions on File Prior to Visit  Medication Sig Dispense Refill  . aspirin 81 MG tablet Take 81 mg by mouth daily.    . Cholecalciferol (VITAMIN D3) 2000 UNITS TABS Take 1 Units by mouth.    . Coenzyme Q10 (CO Q 10) 10 MG CAPS Take 1 tablet by mouth daily.    . Garlic 123XX123 MG CAPS Take 1 capsule by mouth daily.    Marland Kitchen lactobacillus acidophilus (BACID) TABS Take 1 tablet by mouth daily as needed. Reported on 06/02/2015    . Magnesium 250 MG TABS Take 400 mg by mouth daily.     . rosuvastatin (CRESTOR) 10 MG tablet TAKE 1 TABLET BY MOUTH DAILY 30 tablet 3  . Turmeric 500 MG CAPS Take 1 capsule by mouth daily.     No current facility-administered medications on file prior to visit.     Allergies  Allergen Reactions  . Sulfa Antibiotics Anaphylaxis    Throat Swells  . Sulfonamide Derivatives Anaphylaxis    Throat Swells  . Lipitor [Atorvastatin] Other (See Comments)    ? Passed out  . Latex Rash  . Tape Rash    Adhesive bandages    Past Medical History:  Diagnosis Date  . Breast cancer ([place]) 2010   right breast lumpectomy with chemo and rad tx  . Cancer [facility]) 2010   right lumpectomy with chemo and radiation, Triple negative  . Cancer of right breast, stage 1, estrogen receptor negative ([place]) 09/16/2014  . Fatty liver 2016   by CT  . Malignant neoplasm of upper-outer quadrant of female breast ([place])   . Mammographic microcalcification 2013  . Meningitis as infant  . Personal history of malignant neoplasm of breast   . Personal history of  tobacco use, presenting hazards to health   . Shingles 2014  . Special screening for malignant neoplasms, colon   . Thoracic aorta atherosclerosis ([place]) 2016   by CT    Past Surgical History:  Procedure Laterality Date  . ABDOMINAL HYSTERECTOMY  1995  . BREAST BIOPSY Right Jan 2014   stereo negative  . BREAST EXCISIONAL BIOPSY Right 2010   lumptectomy chemo and rad  . BREAST LUMPECTOMY Right 2010   sentinel node bx  . COLONOSCOPY  [id number]   Dr. Vira Agar  . PORT-A-CATH REMOVAL  04/2010  . PORTACATH PLACEMENT  2010   placement  . TONSILLECTOMY  1960  . TUBAL LIGATION      Family History  Problem Relation Age of Onset  . Leukemia Brother   . Colon cancer Neg Hx     Social History   Social History  . Marital status: Married    Spouse name: N/A  . Number of children: N/A  . Years of education: N/A   Occupational History  . Not on file.   Social History Main Topics  . Smoking status: Former Smoker    Years: 18.00  . Smokeless tobacco: Never Used     Comment: quit 1990  . Alcohol use 0.0 oz/week    5 -  6 Glasses of wine per week     Comment: occ  . Drug use: No  . Sexual activity: Not on file   Other Topics Concern  . Not on file   Social History Narrative   Does have a living will.   Desires CPR, does not want prolonged life support if futile.   The PMH, PSH, Social History, Family History, Medications, and allergies have been reviewed in [facility], and have been updated if relevant.  OBJECTIVE: BP 114/70   Pulse 84   Temp 98.5 F (36.9 C) (Oral)   Wt 162 lb (73.5 kg)   SpO2 95%   BMI 27.81 kg/m   She appears well, vital signs are as noted. Ears normal.  Throat and pharynx normal.  Neck supple. No adenopathy in the neck. Nose is congested. Sinuses non tender. The chest is clear, without wheezes or rales.  ASSESSMENT:  allergic rhinitis, resolving bronchitis  PLAN: D/c claritin eRx sent for xyzal 5 mg qhs.  Symptomatic therapy suggested: push  fluids, rest and return office visit prn if symptoms persist or worsen. Call or return to clinic prn if these symptoms worsen or fail to improve as anticipated.

## 2015-12-29 ENCOUNTER — Ambulatory Visit (INDEPENDENT_AMBULATORY_CARE_PROVIDER_SITE_OTHER): Payer: PPO | Admitting: Family Medicine

## 2015-12-29 ENCOUNTER — Encounter: Payer: Self-pay | Admitting: Family Medicine

## 2015-12-29 VITALS — BP 142/70 | HR 93 | Temp 97.8°F | Wt 164.2 lb

## 2015-12-29 DIAGNOSIS — Z171 Estrogen receptor negative status [ER-]: Secondary | ICD-10-CM | POA: Diagnosis not present

## 2015-12-29 DIAGNOSIS — N644 Mastodynia: Secondary | ICD-10-CM | POA: Insufficient documentation

## 2015-12-29 DIAGNOSIS — C50911 Malignant neoplasm of unspecified site of right female breast: Secondary | ICD-10-CM | POA: Diagnosis not present

## 2015-12-29 NOTE — Progress Notes (Signed)
Subjective:   Patient ID: Anna Randolph, female    DOB: [birth year], 65 y.o.   MRN: [id number]  SHIYA KESSELRING is a pleasant 65 y.o. year old female who presents to clinic today with Breast Pain (right)  on 12/29/2015  HPI:  Right breast pain- intermittent for past several months and she feels her right breast "pulls differently" when she raises her arms.  Has not felt any masses.  History of malignant lumpectomy of the right breast in 2010 followed by chemotherapy and radiation. Last mammogram 03/17/15.  Followed by Dr. Oliva Bustard.  Current Outpatient Prescriptions on File Prior to Visit  Medication Sig Dispense Refill  . aspirin 81 MG tablet Take 81 mg by mouth daily.    . Cholecalciferol (VITAMIN D3) 2000 UNITS TABS Take 1 Units by mouth.    . Coenzyme Q10 (CO Q 10) 10 MG CAPS Take 1 tablet by mouth daily.    . Garlic 123XX123 MG CAPS Take 1 capsule by mouth daily.    Marland Kitchen lactobacillus acidophilus (BACID) TABS Take 1 tablet by mouth daily as needed. Reported on 06/02/2015    . Magnesium 250 MG TABS Take 400 mg by mouth daily.     . rosuvastatin (CRESTOR) 10 MG tablet TAKE 1 TABLET BY MOUTH DAILY 30 tablet 3  . Turmeric 500 MG CAPS Take 1 capsule by mouth daily.     No current facility-administered medications on file prior to visit.     Allergies  Allergen Reactions  . Sulfa Antibiotics Anaphylaxis    Throat Swells  . Sulfonamide Derivatives Anaphylaxis    Throat Swells  . Lipitor [Atorvastatin] Other (See Comments)    ? Passed out  . Latex Rash  . Tape Rash    Adhesive bandages    Past Medical History:  Diagnosis Date  . Breast cancer ([place]) 2010   right breast lumpectomy with chemo and rad tx  . Cancer [facility]) 2010   right lumpectomy with chemo and radiation, Triple negative  . Cancer of right breast, stage 1, estrogen receptor negative ([place]) 09/16/2014  . Fatty liver 2016   by CT  . Malignant neoplasm of upper-outer quadrant of female breast ([place])   . Mammographic  microcalcification 2013  . Meningitis as infant  . Personal history of malignant neoplasm of breast   . Personal history of tobacco use, presenting hazards to health   . Shingles 2014  . Special screening for malignant neoplasms, colon   . Thoracic aorta atherosclerosis ([place]) 2016   by CT    Past Surgical History:  Procedure Laterality Date  . ABDOMINAL HYSTERECTOMY  1995  . BREAST BIOPSY Right Jan 2014   stereo negative  . BREAST EXCISIONAL BIOPSY Right 2010   lumptectomy chemo and rad  . BREAST LUMPECTOMY Right 2010   sentinel node bx  . COLONOSCOPY  [id number]   Dr. Vira Agar  . PORT-A-CATH REMOVAL  04/2010  . PORTACATH PLACEMENT  2010   placement  . TONSILLECTOMY  1960  . TUBAL LIGATION      Family History  Problem Relation Age of Onset  . Leukemia Brother   . Colon cancer Neg Hx     Social History   Social History  . Marital status: Married    Spouse name: N/A  . Number of children: N/A  . Years of education: N/A   Occupational History  . Not on file.   Social History Main Topics  . Smoking status: Former Smoker    Years:  18.00  . Smokeless tobacco: Never Used     Comment: quit 1990  . Alcohol use 0.0 oz/week    5 - 6 Glasses of wine per week     Comment: occ  . Drug use: No  . Sexual activity: Not on file   Other Topics Concern  . Not on file   Social History Narrative   Does have a living will.   Desires CPR, does not want prolonged life support if futile.   The PMH, PSH, Social History, Family History, Medications, and allergies have been reviewed in [facility], and have been updated if relevant.  Review of Systems  Constitutional: Negative.   Respiratory: Negative.   Cardiovascular: Negative.        + right breast pain  Genitourinary: Negative.   Musculoskeletal: Negative.   Neurological: Negative.   Psychiatric/Behavioral: Negative.   All other systems reviewed and are negative.      Objective:    BP (!) 142/70   Pulse 93   Temp 97.8 F  (36.6 C) (Oral)   Wt 164 lb 4 oz (74.5 kg)   SpO2 96%   BMI 28.19 kg/m    Physical Exam  Constitutional: She is oriented to person, place, and time. She appears well-developed and well-nourished. No distress.  HENT:  Head: Normocephalic.  Eyes: Conjunctivae are normal.  Cardiovascular: Normal rate.   Pulmonary/Chest: Effort normal. Right breast exhibits no inverted nipple, no mass, no nipple discharge, no skin change and no tenderness. Left breast exhibits no inverted nipple, no mass, no nipple discharge, no skin change and no tenderness. Breasts are symmetrical.    Musculoskeletal: Normal range of motion.  Neurological: She is alert and oriented to person, place, and time. No cranial nerve deficit.  Skin: Skin is warm and dry. She is not diaphoretic.  Psychiatric: She has a normal mood and affect. Her behavior is normal. Judgment and thought content normal.  Nursing note and vitals reviewed.         Assessment & Plan:   Breast pain, right - Plan: MM Digital Diagnostic Unilat R, US BREAST LTD UNI RIGHT INC AXILLA  Cancer of right breast, stage 1, estrogen receptor negative ([place]) - Plan: MM Digital Diagnostic Unilat R, US BREAST LTD UNI RIGHT INC AXILLA No Follow-up on file.

## 2015-12-29 NOTE — Progress Notes (Signed)
Pre visit review using our clinic review tool, if applicable. No additional management support is needed unless otherwise documented below in the visit note. 

## 2015-12-29 NOTE — Patient Instructions (Signed)
Great to see you. Please stop by to see Marion on your way out.   

## 2015-12-29 NOTE — Assessment & Plan Note (Signed)
New- exam reassuring. [place] and mammogram for further evaluation. The patient indicates understanding of these issues and agrees with the plan.

## 2015-12-31 ENCOUNTER — Telehealth: Payer: Self-pay | Admitting: Family Medicine

## 2015-12-31 DIAGNOSIS — Z889 Allergy status to unspecified drugs, medicaments and biological substances status: Secondary | ICD-10-CM

## 2015-12-31 NOTE — Telephone Encounter (Signed)
Referral placed.

## 2015-12-31 NOTE — Telephone Encounter (Signed)
Patient called and requested an Allergy Referral to Dr Levin Erp in [place]. Please place the Allergy Referral.

## 2016-01-04 ENCOUNTER — Other Ambulatory Visit: Payer: Self-pay | Admitting: Family Medicine

## 2016-01-05 ENCOUNTER — Encounter: Payer: Self-pay | Admitting: Family Medicine

## 2016-01-06 ENCOUNTER — Other Ambulatory Visit: Payer: Self-pay | Admitting: Family Medicine

## 2016-01-06 MED ORDER — ESCITALOPRAM OXALATE 10 MG PO TABS: 10.0000 mg | ORAL_TABLET | Freq: Every day | ORAL | 3 refills | Status: DC

## 2016-01-11 ENCOUNTER — Ambulatory Visit
Admission: RE | Admit: 2016-01-11 | Discharge: 2016-01-11 | Disposition: A | Discharge status: Home / Self Care | Payer: PPO | Source: Ambulatory Visit | Attending: Family Medicine | Admitting: Family Medicine

## 2016-01-11 ENCOUNTER — Other Ambulatory Visit: Payer: Self-pay | Admitting: Family Medicine

## 2016-01-11 DIAGNOSIS — C50911 Malignant neoplasm of unspecified site of right female breast: Secondary | ICD-10-CM

## 2016-01-11 DIAGNOSIS — N644 Mastodynia: Secondary | ICD-10-CM

## 2016-01-11 DIAGNOSIS — Z171 Estrogen receptor negative status [ER-]: Secondary | ICD-10-CM | POA: Diagnosis not present

## 2016-01-12 ENCOUNTER — Other Ambulatory Visit: Payer: PPO

## 2016-01-12 ENCOUNTER — Ambulatory Visit: Payer: PPO

## 2016-01-18 DIAGNOSIS — H5213 Myopia, bilateral: Secondary | ICD-10-CM | POA: Diagnosis not present

## 2016-01-18 DIAGNOSIS — H52223 Regular astigmatism, bilateral: Secondary | ICD-10-CM | POA: Diagnosis not present

## 2016-01-18 DIAGNOSIS — H2513 Age-related nuclear cataract, bilateral: Secondary | ICD-10-CM | POA: Diagnosis not present

## 2016-01-18 DIAGNOSIS — H524 Presbyopia: Secondary | ICD-10-CM | POA: Diagnosis not present

## 2016-02-10 DIAGNOSIS — R05 Cough: Secondary | ICD-10-CM | POA: Diagnosis not present

## 2016-02-10 DIAGNOSIS — H1045 Other chronic allergic conjunctivitis: Secondary | ICD-10-CM | POA: Diagnosis not present

## 2016-02-10 DIAGNOSIS — J309 Allergic rhinitis, unspecified: Secondary | ICD-10-CM | POA: Diagnosis not present

## 2016-02-22 ENCOUNTER — Ambulatory Visit (HOSPITAL_COMMUNITY)
Admission: EM | Admit: 2016-02-22 | Discharge: 2016-02-22 | Disposition: A | Discharge status: Home / Self Care | Payer: PPO | Attending: Family Medicine | Admitting: Family Medicine

## 2016-02-22 ENCOUNTER — Encounter (HOSPITAL_COMMUNITY): Payer: Self-pay | Admitting: Family Medicine

## 2016-02-22 ENCOUNTER — Telehealth (HOSPITAL_COMMUNITY): Payer: Self-pay | Admitting: Emergency Medicine

## 2016-02-22 ENCOUNTER — Telehealth: Payer: Self-pay

## 2016-02-22 DIAGNOSIS — Z923 Personal history of irradiation: Secondary | ICD-10-CM | POA: Diagnosis not present

## 2016-02-22 DIAGNOSIS — Z87891 Personal history of nicotine dependence: Secondary | ICD-10-CM | POA: Diagnosis not present

## 2016-02-22 DIAGNOSIS — Z888 Allergy status to other drugs, medicaments and biological substances status: Secondary | ICD-10-CM | POA: Insufficient documentation

## 2016-02-22 DIAGNOSIS — Z882 Allergy status to sulfonamides status: Secondary | ICD-10-CM | POA: Diagnosis not present

## 2016-02-22 DIAGNOSIS — R319 Hematuria, unspecified: Secondary | ICD-10-CM | POA: Diagnosis not present

## 2016-02-22 DIAGNOSIS — Z9221 Personal history of antineoplastic chemotherapy: Secondary | ICD-10-CM | POA: Insufficient documentation

## 2016-02-22 DIAGNOSIS — N390 Urinary tract infection, site not specified: Secondary | ICD-10-CM

## 2016-02-22 DIAGNOSIS — R350 Frequency of micturition: Secondary | ICD-10-CM | POA: Diagnosis not present

## 2016-02-22 DIAGNOSIS — Z803 Family history of malignant neoplasm of breast: Secondary | ICD-10-CM | POA: Insufficient documentation

## 2016-02-22 DIAGNOSIS — Z91040 Latex allergy status: Secondary | ICD-10-CM | POA: Insufficient documentation

## 2016-02-22 LAB — POCT URINALYSIS DIP (DEVICE)
BILIRUBIN URINE: NEGATIVE
GLUCOSE, UA: NEGATIVE mg/dL
Ketones, ur: NEGATIVE mg/dL
NITRITE: NEGATIVE
Protein, ur: NEGATIVE mg/dL
Specific Gravity, Urine: 1.01 (ref 1.005–1.030)
UROBILINOGEN UA: 0.2 mg/dL (ref 0.0–1.0)
pH: 7 (ref 5.0–8.0)

## 2016-02-22 MED ORDER — NITROFURANTOIN MONOHYD MACRO 100 MG PO CAPS: 100.0000 mg | ORAL_CAPSULE | Freq: Two times a day (BID) | ORAL | 0 refills | Status: DC

## 2016-02-22 MED ORDER — CEPHALEXIN 500 MG PO CAPS: 500.0000 mg | ORAL_CAPSULE | Freq: Three times a day (TID) | ORAL | 0 refills | Status: AC

## 2016-02-22 NOTE — Telephone Encounter (Signed)
We can still do a urine cx even if she is taking AZO.  Can she come see me tomorrow?

## 2016-02-22 NOTE — Telephone Encounter (Signed)
Patient stated that her insurance would not cover the Macrobid she was prescribed. Emilio Math advised to e-scribe Keflex 500mg  1 PO tid for 7 days. [facility] [place] at the patient's request.

## 2016-02-22 NOTE — ED Provider Notes (Signed)
CSN: [id number]     Arrival date & time 02/22/16  1033 History   None    Chief Complaint  Patient presents with  . Urinary Frequency   (Consider location/radiation/quality/duration/timing/severity/associated sxs/prior Treatment) Patient c/o dysuria and urinary frequency for 2 days.  She has AZO but has not started any yet.  She states she has not had UTI in over 4 years.   The history is provided by the patient.  Urinary Frequency  This is a new problem. The current episode started 2 days ago. The problem occurs constantly. The problem has not changed since onset.Nothing aggravates the symptoms. Nothing relieves the symptoms. She has tried nothing for the symptoms.    Past Medical History:  Diagnosis Date  . Breast cancer ([place]) 2010   right breast lumpectomy with chemo and rad tx  . Cancer [facility]) 2010   right lumpectomy with chemo and radiation, Triple negative  . Cancer of right breast, stage 1, estrogen receptor negative ([place]) 09/16/2014  . Fatty liver 2016   by CT  . Malignant neoplasm of upper-outer quadrant of female breast ([place])   . Mammographic microcalcification 2013  . Meningitis as infant  . Personal history of malignant neoplasm of breast   . Personal history of tobacco use, presenting hazards to health   . Shingles 2014  . Special screening for malignant neoplasms, colon   . Thoracic aorta atherosclerosis ([place]) 2016   by CT   Past Surgical History:  Procedure Laterality Date  . ABDOMINAL HYSTERECTOMY  1995  . BREAST BIOPSY Right Jan 2014   stereo negative  . BREAST EXCISIONAL BIOPSY Right 2010   lumptectomy chemo and rad  . BREAST LUMPECTOMY Right 2010   sentinel node bx  . COLONOSCOPY  [id number]   Dr. Vira Agar  . PORT-A-CATH REMOVAL  04/2010  . PORTACATH PLACEMENT  2010   placement  . TONSILLECTOMY  1960  . TUBAL LIGATION     Family History  Problem Relation Age of Onset  . Leukemia Brother   . Colon cancer Neg Hx    Social History  Substance Use  Topics  . Smoking status: Former Smoker    Years: 18.00  . Smokeless tobacco: Never Used     Comment: quit 1990  . Alcohol use 0.0 oz/week    5 - 6 Glasses of wine per week     Comment: occ   OB History    Gravida Para Term Preterm AB Living   2 2       2    SAB TAB Ectopic Multiple Live Births                  Obstetric Comments   Age with first menstruation-12 Age with first pregnancy-18 LMP-1995, hysterectomy     Review of Systems  Constitutional: Negative.   HENT: Negative.   Eyes: Negative.   Respiratory: Negative.   Cardiovascular: Negative.   Gastrointestinal: Negative.   Genitourinary: Positive for frequency.    Allergies  Sulfa antibiotics; Sulfonamide derivatives; Lipitor [atorvastatin]; Latex; and Tape  Home Medications   Prior to Admission medications   Medication Sig Start Date End Date Taking? Authorizing Provider  aspirin 81 MG tablet Take 81 mg by mouth daily.    Historical Provider, MD  Cholecalciferol (VITAMIN D3) 2000 UNITS TABS Take 1 Units by mouth.    Historical Provider, MD  Coenzyme Q10 (CO Q 10) 10 MG CAPS Take 1 tablet by mouth daily.    Historical Provider, MD  escitalopram (LEXAPRO) 10 MG tablet Take 1 tablet (10 mg total) by mouth daily. 01/06/16   Lucille Passy, MD  Garlic 123XX123 MG CAPS Take 1 capsule by mouth daily.    Historical Provider, MD  lactobacillus acidophilus (BACID) TABS Take 1 tablet by mouth daily as needed. Reported on 06/02/2015    Historical Provider, MD  Magnesium 250 MG TABS Take 400 mg by mouth daily.     Historical Provider, MD  nitrofurantoin, macrocrystal-monohydrate, (MACROBID) 100 MG capsule Take 1 capsule (100 mg total) by mouth 2 (two) times daily. 02/22/16   Lysbeth Penner, FNP  rosuvastatin (CRESTOR) 10 MG tablet TAKE 1 TABLET BY MOUTH DAILY 01/04/16   Lucille Passy, MD  Turmeric 500 MG CAPS Take 1 capsule by mouth daily.    Historical Provider, MD   Meds Ordered and Administered this Visit  Medications - No data  to display  BP 162/81 (BP Location: Left Arm)   Pulse 89   Temp 98.1 F (36.7 C) (Oral)   Resp 16   SpO2 100%  No data found.   Physical Exam  Constitutional: She appears well-developed and well-nourished.  HENT:  Head: Normocephalic and atraumatic.  Mouth/Throat: Oropharynx is clear and moist.  Eyes: EOM are normal. Pupils are equal, round, and reactive to light.  Cardiovascular: Normal rate, regular rhythm and normal heart sounds.   Pulmonary/Chest: Effort normal and breath sounds normal.  Abdominal: Soft. Bowel sounds are normal.  Nursing note and vitals reviewed.   Urgent Care Course   Clinical Course     Procedures (including critical care time)  Labs Review Labs Reviewed  POCT URINALYSIS DIP (DEVICE) - Abnormal; Notable for the following:       Result Value   Hgb urine dipstick LARGE (*)    Leukocytes, UA SMALL (*)    All other components within normal limits  URINE CULTURE    Imaging Review No results found.   Visual Acuity Review  Right Eye Distance:   Left Eye Distance:   Bilateral Distance:    Right Eye Near:   Left Eye Near:    Bilateral Near:         MDM   1. Urinary tract infection with hematuria, site unspecified    Urine CX Macrobid 100mg  one po bid x 10 days #20  Push po fluids, rest, tylenol and motrin otc prn as directed for fever, arthralgias, and myalgias.  Follow up prn if sx's continue or persist.    Lysbeth Penner, FNP 02/22/16 [phone]

## 2016-02-22 NOTE — Telephone Encounter (Signed)
Pt left v/m; pt has a UTI that started this morning; no available appt at [facility] or any [facility] that pt can go to. There are available appts at [facility]. Pt will start AZO today and wants to know what to do about scheduling an appt later due to pt starting AZO. Pt request cb. Unable to reach pt by phone.Please advise. [place].

## 2016-02-22 NOTE — Telephone Encounter (Signed)
Lm on pts vm and advised per Dr Deborra Medina. Pt advised to contact office and schedule OV if wanting to be seen

## 2016-02-22 NOTE — ED Triage Notes (Signed)
Pt here for urinary frequency, urgency that started this am. sts pressure in abdomen.

## 2016-02-24 ENCOUNTER — Telehealth (HOSPITAL_COMMUNITY): Payer: Self-pay | Admitting: Emergency Medicine

## 2016-02-24 LAB — URINE CULTURE: Culture: 80000 — AB

## 2016-02-24 MED ORDER — CIPROFLOXACIN HCL 500 MG PO TABS: 500.0000 mg | ORAL_TABLET | Freq: Two times a day (BID) | ORAL | 0 refills | Status: DC

## 2016-02-24 NOTE — Telephone Encounter (Signed)
Called pt and notified of recent lab results Pt ID'd properly... Reports feeling better but now she has now developed ulcers inside of mouth... Per Dr. Bridgett Larsson... Ok to stop keflex and call in Cipro 5000 mg BID x3 days Per pt's request, [facility]) Adv pt if sx are not getting better to return or to f/u w/PCP Pt verb understanding.

## 2016-03-16 ENCOUNTER — Encounter: Payer: Self-pay | Admitting: Family Medicine

## 2016-03-16 ENCOUNTER — Other Ambulatory Visit: Payer: Self-pay | Admitting: Family Medicine

## 2016-03-16 MED ORDER — ESCITALOPRAM OXALATE 5 MG PO TABS: 5.0000 mg | ORAL_TABLET | Freq: Every day | ORAL | 6 refills | Status: DC

## 2016-03-16 NOTE — Telephone Encounter (Signed)
Pt will need a new Rx for 5mg  if agreeable

## 2016-03-17 NOTE — Telephone Encounter (Signed)
We dont have any additional openings on today

## 2016-03-20 ENCOUNTER — Ambulatory Visit: Payer: PPO

## 2016-04-03 ENCOUNTER — Ambulatory Visit
Admission: RE | Admit: 2016-04-03 | Discharge: 2016-04-03 | Disposition: A | Discharge status: Home / Self Care | Payer: PPO | Source: Ambulatory Visit | Attending: Family Medicine | Admitting: Family Medicine

## 2016-04-03 DIAGNOSIS — Z1231 Encounter for screening mammogram for malignant neoplasm of breast: Secondary | ICD-10-CM | POA: Diagnosis not present

## 2016-04-03 DIAGNOSIS — C50911 Malignant neoplasm of unspecified site of right female breast: Secondary | ICD-10-CM

## 2016-04-03 DIAGNOSIS — Z171 Estrogen receptor negative status [ER-]: Secondary | ICD-10-CM

## 2016-04-03 HISTORY — DX: Personal history of antineoplastic chemotherapy: Z92.21

## 2016-04-03 HISTORY — DX: Personal history of irradiation: Z92.3

## 2016-04-04 ENCOUNTER — Other Ambulatory Visit: Payer: Self-pay | Admitting: Family Medicine

## 2016-04-04 DIAGNOSIS — E785 Hyperlipidemia, unspecified: Secondary | ICD-10-CM

## 2016-04-04 DIAGNOSIS — Z01419 Encounter for gynecological examination (general) (routine) without abnormal findings: Secondary | ICD-10-CM

## 2016-04-05 ENCOUNTER — Ambulatory Visit (INDEPENDENT_AMBULATORY_CARE_PROVIDER_SITE_OTHER): Payer: PPO

## 2016-04-05 VITALS — BP 136/88 | HR 83 | Temp 98.6°F | Ht 63.5 in | Wt 163.2 lb

## 2016-04-05 DIAGNOSIS — Z0000 Encounter for general adult medical examination without abnormal findings: Secondary | ICD-10-CM | POA: Diagnosis not present

## 2016-04-05 DIAGNOSIS — E785 Hyperlipidemia, unspecified: Secondary | ICD-10-CM

## 2016-04-05 DIAGNOSIS — E2839 Other primary ovarian failure: Secondary | ICD-10-CM

## 2016-04-05 DIAGNOSIS — Z01419 Encounter for gynecological examination (general) (routine) without abnormal findings: Secondary | ICD-10-CM | POA: Diagnosis not present

## 2016-04-05 LAB — LDL CHOLESTEROL, DIRECT: LDL DIRECT: 123 mg/dL

## 2016-04-05 LAB — CBC WITH DIFFERENTIAL/PLATELET
BASOS ABS: 0 10*3/uL (ref 0.0–0.1)
Basophils Relative: 0.7 % (ref 0.0–3.0)
EOS ABS: 0.1 10*3/uL (ref 0.0–0.7)
Eosinophils Relative: 1.6 % (ref 0.0–5.0)
HEMATOCRIT: 44.7 % (ref 36.0–46.0)
HEMOGLOBIN: 15.1 g/dL — AB (ref 12.0–15.0)
LYMPHS PCT: 21.3 % (ref 12.0–46.0)
Lymphs Abs: 1.1 10*3/uL (ref 0.7–4.0)
MCHC: 33.7 g/dL (ref 30.0–36.0)
MCV: 93.5 fl (ref 78.0–100.0)
Monocytes Absolute: 0.5 10*3/uL (ref 0.1–1.0)
Monocytes Relative: 9.1 % (ref 3.0–12.0)
Neutro Abs: 3.6 10*3/uL (ref 1.4–7.7)
Neutrophils Relative %: 67.3 % (ref 43.0–77.0)
Platelets: 266 10*3/uL (ref 150.0–400.0)
RBC: 4.78 Mil/uL (ref 3.87–5.11)
RDW: 13.4 % (ref 11.5–15.5)
WBC: 5.4 10*3/uL (ref 4.0–10.5)

## 2016-04-05 LAB — COMPREHENSIVE METABOLIC PANEL
ALBUMIN: 4.6 g/dL (ref 3.5–5.2)
ALT: 21 U/L (ref 0–35)
AST: 24 U/L (ref 0–37)
Alkaline Phosphatase: 70 U/L (ref 39–117)
BILIRUBIN TOTAL: 0.5 mg/dL (ref 0.2–1.2)
BUN: 13 mg/dL (ref 6–23)
CALCIUM: 9.9 mg/dL (ref 8.4–10.5)
CO2: 29 mEq/L (ref 19–32)
CREATININE: 0.75 mg/dL (ref 0.40–1.20)
Chloride: 101 mEq/L (ref 96–112)
GFR: 82.15 mL/min (ref >60.00)
Glucose, Bld: 88 mg/dL (ref 70–99)
Potassium: 4.2 mEq/L (ref 3.5–5.1)
SODIUM: 138 meq/L (ref 135–145)
TOTAL PROTEIN: 7.7 g/dL (ref 6.0–8.3)

## 2016-04-05 LAB — LIPID PANEL
CHOLESTEROL: 234 mg/dL — AB (ref 0–200)
HDL: 73.4 mg/dL (ref >39.00)
NonHDL: 160.95
TRIGLYCERIDES: 220 mg/dL — AB (ref 0.0–149.0)
Total CHOL/HDL Ratio: 3
VLDL: 44 mg/dL — ABNORMAL HIGH (ref 0.0–40.0)

## 2016-04-05 LAB — TSH: TSH: 4.13 u[IU]/mL (ref 0.35–4.50)

## 2016-04-05 NOTE — Patient Instructions (Signed)
Anna Randolph , Thank you for taking time to come for your Medicare Wellness Visit. I appreciate your ongoing commitment to your health goals. Please review the following plan we discussed and let me know if I can assist you in the future.   These are the goals we discussed: Goals    . Increase physical activity          Starting Feb 2018 when weather permits, I will increase exercise to 30 min 3-5 days/week.        This is a list of the screening recommended for you and due dates:  Health Maintenance  Topic Date Due  . Pneumonia vaccines (2 of 2 - PPSV23) 05/03/2016*  . DEXA scan (bone density measurement)  04/05/2017*  . Tetanus Vaccine  06/14/2024*  . Mammogram  04/03/2018  . Colon Cancer Screening  06/01/2020  . Flu Shot  Completed  . Shingles Vaccine  Addressed  .  Hepatitis C: One time screening is recommended by Center for Disease Control  (CDC) for  adults born from [birth year] through 1965.   Completed  *Topic was postponed. The date shown is not the original due date.   Preventive Care for Adults  A healthy lifestyle and preventive care can promote health and wellness. Preventive health guidelines for adults include the following key practices.  . A routine yearly physical is a good way to check with your health care provider about your health and preventive screening. It is a chance to share any concerns and updates on your health and to receive a thorough exam.  . Visit your dentist for a routine exam and preventive care every 6 months. Brush your teeth twice a day and floss once a day. Good oral hygiene prevents tooth decay and gum disease.  . The frequency of eye exams is based on your age, health, family medical history, use  of contact lenses, and other factors. Follow your health care provider's ecommendations for frequency of eye exams.  . Eat a healthy diet. Foods like vegetables, fruits, whole grains, low-fat dairy products, and lean protein foods contain the nutrients  you need without too many calories. Decrease your intake of foods high in solid fats, added sugars, and salt. Eat the right amount of calories for you. Get information about a proper diet from your health care provider, if necessary.  . Regular physical exercise is one of the most important things you can do for your health. Most adults should get at least 150 minutes of moderate-intensity exercise (any activity that increases your heart rate and causes you to sweat) each week. In addition, most adults need muscle-strengthening exercises on 2 or more days a week.  Silver Sneakers may be a benefit available to you. To determine eligibility, you may visit the website: www.silversneakers.com or contact program at 6036104248 Mon-Fri between 8AM-8PM.   . Maintain a healthy weight. The body mass index (BMI) is a screening tool to identify possible weight problems. It provides an estimate of body fat based on height and weight. Your health care provider can find your BMI and can help you achieve or maintain a healthy weight.   For adults 20 years and older: ? A BMI below 18.5 is considered underweight. ? A BMI of 18.5 to 24.9 is normal. ? A BMI of 25 to 29.9 is considered overweight. ? A BMI of 30 and above is considered obese.   . Maintain normal blood lipids and cholesterol levels by exercising and minimizing your intake of  saturated fat. Eat a balanced diet with plenty of fruit and vegetables. Blood tests for lipids and cholesterol should begin at age 31 and be repeated every 5 years. If your lipid or cholesterol levels are high, you are over 50, or you are at high risk for heart disease, you may need your cholesterol levels checked more frequently. Ongoing high lipid and cholesterol levels should be treated with medicines if diet and exercise are not working.  . If you smoke, find out from your health care provider how to quit. If you do not use tobacco, please do not start.  . If you choose to  drink alcohol, please do not consume more than 2 drinks per day. One drink is considered to be 12 ounces (355 mL) of beer, 5 ounces (148 mL) of wine, or 1.5 ounces (44 mL) of liquor.  . If you are 11-27 years old, ask your health care provider if you should take aspirin to prevent strokes.  . Use sunscreen. Apply sunscreen liberally and repeatedly throughout the day. You should seek shade when your shadow is shorter than you. Protect yourself by wearing long sleeves, pants, a wide-brimmed hat, and sunglasses year round, whenever you are outdoors.  . Once a month, do a whole body skin exam, using a mirror to look at the skin on your back. Tell your health care provider of new moles, moles that have irregular borders, moles that are larger than a pencil eraser, or moles that have changed in shape or color.

## 2016-04-05 NOTE — Progress Notes (Signed)
Pre visit review using our clinic review tool, if applicable. No additional management support is needed unless otherwise documented below in the visit note. 

## 2016-04-05 NOTE — Progress Notes (Signed)
PCP notes:   Health maintenance:  Bone density - addressed; pt will complete in 2019 PPSV23 - pt will discuss necessity of vaccine with PCP Tetanus vaccine - pt will discuss with PCP at CPE  Abnormal screenings:   None  Patient concerns:   None  Nurse concerns:  None  Next PCP appt:   04/11/16 @ 0915

## 2016-04-05 NOTE — Progress Notes (Signed)
Subjective:   Anna Randolph is a 66 y.o. female who presents for Medicare Annual (Subsequent) preventive examination.  Review of Systems:  N/A Cardiac Risk Factors include: advanced age (>25men, >74 women);dyslipidemia     Objective:     Vitals: BP 136/88 (BP Location: Right Arm, Patient Position: Sitting, Cuff Size: Normal) Comment: no BP medications taken  Pulse 83   Temp 98.6 F (37 C) (Oral)   Ht 5' 3.5" (1.613 m)   Wt 163 lb 4 oz (74 kg)   SpO2 96%   BMI 28.46 kg/m   Body mass index is 28.46 kg/m.   Tobacco History  Smoking Status  . Former Smoker  . Years: 18.00  Smokeless Tobacco  . Never Used    Comment: quit 1990     Counseling given: No   Past Medical History:  Diagnosis Date  . Breast cancer ([place]) 2010   right breast lumpectomy with chemo and rad tx  . Cancer [facility]) 2010   right lumpectomy with chemo and radiation, Triple negative  . Cancer of right breast, stage 1, estrogen receptor negative ([place]) 09/16/2014  . Fatty liver 2016   by CT  . Malignant neoplasm of upper-outer quadrant of female breast ([place])   . Mammographic microcalcification 2013  . Meningitis as infant  . Personal history of chemotherapy 2010   BREAST CA  . Personal history of malignant neoplasm of breast   . Personal history of radiation therapy 2010   BREAST CA  . Personal history of tobacco use, presenting hazards to health   . Shingles 2014  . Special screening for malignant neoplasms, colon   . Thoracic aorta atherosclerosis ([place]) 2016   by CT   Past Surgical History:  Procedure Laterality Date  . ABDOMINAL HYSTERECTOMY  1995  . BREAST BIOPSY Right Jan 2014   stereo negative  . BREAST EXCISIONAL BIOPSY Right 2010   lumptectomy chemo and rad  . BREAST LUMPECTOMY Right 2010   sentinel node bx  . COLONOSCOPY  [id number]   Dr. Vira Agar  . PORT-A-CATH REMOVAL  04/2010  . PORTACATH PLACEMENT  2010   placement  . TONSILLECTOMY  1960  . TUBAL LIGATION     Family  History  Problem Relation Age of Onset  . Leukemia Brother   . Colon cancer Neg Hx   . Breast cancer Neg Hx    History  Sexual Activity  . Sexual activity: No    Outpatient Encounter Prescriptions as of 04/05/2016  Medication Sig  . aspirin 81 MG tablet Take 81 mg by mouth daily.  . Cholecalciferol (VITAMIN D3) 2000 UNITS TABS Take 1 Units by mouth.  . Coenzyme Q10 (CO Q 10) 10 MG CAPS Take 1 tablet by mouth daily.  Marland Kitchen escitalopram (LEXAPRO) 5 MG tablet Take 1 tablet (5 mg total) by mouth daily.  . Garlic 123XX123 MG CAPS Take 1 capsule by mouth daily.  Marland Kitchen lactobacillus acidophilus (BACID) TABS Take 1 tablet by mouth daily as needed. Reported on 06/02/2015  . loratadine (CLARITIN) 10 MG tablet Take 10 mg by mouth daily.  . Magnesium 250 MG TABS Take 400 mg by mouth daily.   . rosuvastatin (CRESTOR) 10 MG tablet TAKE 1 TABLET BY MOUTH DAILY  . Turmeric 500 MG CAPS Take 1 capsule by mouth daily.  . [DISCONTINUED] ciprofloxacin (CIPRO) 500 MG tablet Take 1 tablet (500 mg total) by mouth 2 (two) times daily.  . [DISCONTINUED] nitrofurantoin, macrocrystal-monohydrate, (MACROBID) 100 MG capsule Take 1 capsule (  100 mg total) by mouth 2 (two) times daily.   No facility-administered encounter medications on file as of 04/05/2016.     Activities of Daily Living In your present state of health, do you have any difficulty performing the following activities: 04/05/2016  Hearing? Y  Vision? N  Difficulty concentrating or making decisions? N  Walking or climbing stairs? Y  Dressing or bathing? N  Doing errands, shopping? N  Preparing Food and eating ? N  Using the Toilet? N  In the past six months, have you accidently leaked urine? Y  Do you have problems with loss of bowel control? N  Managing your Medications? N  Managing your Finances? N  Housekeeping or managing your Housekeeping? N  Some recent data might be hidden    Patient Care Team: Lucille Passy, MD as PCP - General (Family  Medicine) Seeplaputhur Robinette Haines, MD (General Surgery) Forest Gleason, MD (Oncology) Mosetta Anis, MD as Referring Physician (Allergy)    Assessment:     Hearing Screening   125Hz  250Hz  500Hz  1000Hz  2000Hz  3000Hz  4000Hz  6000Hz  8000Hz   Right ear:   40 40 40  40    Left ear:   40 40 40  40    Vision Screening Comments: Last vision exam in November 2017 with Dr. [place]  Exercise Activities and Dietary recommendations Current Exercise Habits: Home exercise routine;Structured exercise class, Type of exercise: yoga;walking;stretching, Time (Minutes): 30, Frequency (Times/Week): 2, Weekly Exercise (Minutes/Week): 60, Intensity: Mild, Exercise limited by: None identified  Goals    . Increase physical activity          Starting Feb 2018 when weather permits, I will increase exercise to 30 min 3-5 days/week.       Fall Risk Fall Risk  04/05/2016 03/04/2013  Falls in the past year? No No   Depression Screen PHQ 2/9 Scores 04/05/2016 03/04/2013  PHQ - 2 Score 0 0     Cognitive Function MMSE - Mini Mental State Exam 04/05/2016  Orientation to time 5  Orientation to Place 5  Registration 3  Attention/ Calculation 0  Recall 3  Language- name 2 objects 0  Language- repeat 1  Language- follow 3 step command 3  Language- read & follow direction 0  Write a sentence 0  Copy design 0  Total score 20       PLEASE NOTE: A Mini-Cog screen was completed. Maximum score is 20. A value of 0 denotes this part of Folstein MMSE was not completed or the patient failed this part of the Mini-Cog screening.   Mini-Cog Screening Orientation to Time - Max 5 pts Orientation to Place - Max 5 pts Registration - Max 3 pts Recall - Max 3 pts Language Repeat - Max 1 pts Language Follow 3 Step Command - Max 3 pts   Immunization History  Administered Date(s) Administered  . Influenza,inj,Quad PF,36+ Mos 12/12/2012, 12/01/2015  . Influenza-Unspecified 12/26/2013, 12/25/2014  . Pneumococcal Conjugate-13  03/09/2014  . Pneumococcal Polysaccharide-23 03/06/2010  . Tdap 06/04/2004   Screening Tests Health Maintenance  Topic Date Due  . PNA vac Low Risk Adult (2 of 2 - PPSV23) 05/03/2016 (Originally 03/11/2015)  . DEXA SCAN  04/05/2017 (Originally 03/11/2015)  . TETANUS/TDAP  06/14/2024 (Originally 06/16/2014)  . MAMMOGRAM  04/03/2018  . COLONOSCOPY  06/01/2020  . INFLUENZA VACCINE  Completed  . ZOSTAVAX  Addressed  . Hepatitis C Screening  Completed      Plan:     I have personally reviewed and  addressed the Medicare Annual Wellness questionnaire and have noted the following in the patient's chart:  A. Medical and social history B. Use of alcohol, tobacco or illicit drugs  C. Current medications and supplements D. Functional ability and status E.  Nutritional status F.  Physical activity G. Advance directives H. List of other physicians I.  Hospitalizations, surgeries, and ER visits in previous 12 months J.  [place] to include hearing, vision, cognitive, depression L. Referrals and appointments - none  In addition, I have reviewed and discussed with patient certain preventive protocols, quality metrics, and best practice recommendations. A written personalized care plan for preventive services as well as general preventive health recommendations were provided to patient.  See attached scanned questionnaire for additional information.   Signed,   Lindell Noe, MHA, BS, LPN Health Coach

## 2016-04-06 ENCOUNTER — Other Ambulatory Visit: Payer: Self-pay | Admitting: Family Medicine

## 2016-04-06 NOTE — Progress Notes (Signed)
I reviewed health advisor's note, was available for consultation, and agree with documentation and plan.  

## 2016-04-10 ENCOUNTER — Ambulatory Visit
Admission: RE | Admit: 2016-04-10 | Discharge: 2016-04-10 | Disposition: A | Discharge status: Home / Self Care | Payer: PPO | Source: Ambulatory Visit | Attending: Family Medicine | Admitting: Family Medicine

## 2016-04-10 DIAGNOSIS — E2839 Other primary ovarian failure: Secondary | ICD-10-CM | POA: Insufficient documentation

## 2016-04-10 DIAGNOSIS — M810 Age-related osteoporosis without current pathological fracture: Secondary | ICD-10-CM | POA: Diagnosis not present

## 2016-04-10 DIAGNOSIS — M818 Other osteoporosis without current pathological fracture: Secondary | ICD-10-CM | POA: Diagnosis not present

## 2016-04-11 ENCOUNTER — Ambulatory Visit (INDEPENDENT_AMBULATORY_CARE_PROVIDER_SITE_OTHER): Payer: PPO | Admitting: Family Medicine

## 2016-04-11 ENCOUNTER — Encounter: Payer: Self-pay | Admitting: Family Medicine

## 2016-04-11 VITALS — BP 132/78 | HR 96 | Temp 98.6°F | Ht 64.0 in | Wt 166.0 lb

## 2016-04-11 DIAGNOSIS — E785 Hyperlipidemia, unspecified: Secondary | ICD-10-CM

## 2016-04-11 DIAGNOSIS — Z636 Dependent relative needing care at home: Secondary | ICD-10-CM

## 2016-04-11 DIAGNOSIS — M810 Age-related osteoporosis without current pathological fracture: Secondary | ICD-10-CM | POA: Diagnosis not present

## 2016-04-11 DIAGNOSIS — Z0000 Encounter for general adult medical examination without abnormal findings: Secondary | ICD-10-CM

## 2016-04-11 DIAGNOSIS — Z01419 Encounter for gynecological examination (general) (routine) without abnormal findings: Secondary | ICD-10-CM

## 2016-04-11 MED ORDER — ALENDRONATE SODIUM 70 MG PO TABS: 70.0000 mg | ORAL_TABLET | ORAL | 11 refills | Status: DC

## 2016-04-11 NOTE — Patient Instructions (Signed)
Great to see you. Happy Birthday!  Keep me updated about how you tolerate the fosamax.

## 2016-04-11 NOTE — Assessment & Plan Note (Signed)
Symptoms improved with lexapro. No changes made today.

## 2016-04-11 NOTE — Assessment & Plan Note (Signed)
Discussed dietary sources of calcium and vitamin D. Start fosamax weekly.

## 2016-04-11 NOTE — Assessment & Plan Note (Signed)
Well controlled on current dose of statin. No changes made today. 

## 2016-04-11 NOTE — Assessment & Plan Note (Signed)
Reviewed preventive care protocols, scheduled due services, and updated immunizations Discussed nutrition, exercise, diet, and healthy lifestyle.  

## 2016-04-11 NOTE — Progress Notes (Signed)
Subjective:   Patient ID: Anna Randolph, female    DOB: Feb 26, 1951, 66 y.o.   MRN: [id number]  Anna Randolph is a pleasant 66 y.o. year old female who presents to clinic today with Annual Exam  on 04/11/2016  HPI:  Annual medicare wellness visit done by Candis Musa, RN on 04/05/16.  Note reviewed.  Osteoporosis- was on fosamax over 10 years ago. Thinks she stopped it when she was diagnosed and treated for cancer.  Mammogram 04/03/16  DEXA showed osteoporosis.   HLD- still taking Crestor 10 mg daily. Lab Results  Component Value Date   CHOL 234 (H) 04/05/2016   HDL 73.40 04/05/2016   LDLCALC 109 (H) 03/19/2015   LDLDIRECT 123.0 04/05/2016   TRIG 220.0 (H) 04/05/2016   CHOLHDL 3 04/05/2016   Lab Results  Component Value Date   ALT 21 04/05/2016   AST 24 04/05/2016   ALKPHOS 70 04/05/2016   BILITOT 0.5 04/05/2016   Depression- pleased with lexapro 5 mg daily daily.   Current Outpatient Prescriptions on File Prior to Visit  Medication Sig Dispense Refill  . aspirin 81 MG tablet Take 81 mg by mouth daily.    . Cholecalciferol (VITAMIN D3) 2000 UNITS TABS Take 1 Units by mouth.    . Coenzyme Q10 (CO Q 10) 10 MG CAPS Take 1 tablet by mouth daily.    Marland Kitchen escitalopram (LEXAPRO) 5 MG tablet Take 1 tablet (5 mg total) by mouth daily. 30 tablet 6  . Garlic 123XX123 MG CAPS Take 1 capsule by mouth daily.    Marland Kitchen lactobacillus acidophilus (BACID) TABS Take 1 tablet by mouth daily as needed. Reported on 06/02/2015    . loratadine (CLARITIN) 10 MG tablet Take 10 mg by mouth daily.    . Magnesium 250 MG TABS Take 400 mg by mouth daily.     . rosuvastatin (CRESTOR) 10 MG tablet TAKE 1 TABLET BY MOUTH DAILY 90 tablet 2  . Turmeric 500 MG CAPS Take 1 capsule by mouth daily.     No current facility-administered medications on file prior to visit.     Allergies  Allergen Reactions  . Sulfa Antibiotics Anaphylaxis    Throat Swells  . Sulfonamide Derivatives Anaphylaxis    Throat  Swells  . Keflex [Cephalexin] Other (See Comments)    Sores in mouth  . Lipitor [Atorvastatin] Other (See Comments)    ? Passed out  . Latex Rash  . Tape Rash    Adhesive bandages    Past Medical History:  Diagnosis Date  . Breast cancer ([place]) 2010   right breast lumpectomy with chemo and rad tx  . Cancer [facility]) 2010   right lumpectomy with chemo and radiation, Triple negative  . Cancer of right breast, stage 1, estrogen receptor negative ([place]) 09/16/2014  . Fatty liver 2016   by CT  . Malignant neoplasm of upper-outer quadrant of female breast ([place])   . Mammographic microcalcification 2013  . Meningitis as infant  . Personal history of chemotherapy 2010   BREAST CA  . Personal history of malignant neoplasm of breast   . Personal history of radiation therapy 2010   BREAST CA  . Personal history of tobacco use, presenting hazards to health   . Shingles 2014  . Special screening for malignant neoplasms, colon   . Thoracic aorta atherosclerosis ([place]) 2016   by CT    Past Surgical History:  Procedure Laterality Date  . ABDOMINAL HYSTERECTOMY  1995  . BREAST  BIOPSY Right Jan 2014   stereo negative  . BREAST EXCISIONAL BIOPSY Right 2010   lumptectomy chemo and rad  . BREAST LUMPECTOMY Right 2010   sentinel node bx  . COLONOSCOPY  [id number]   Dr. Vira Agar  . PORT-A-CATH REMOVAL  04/2010  . PORTACATH PLACEMENT  2010   placement  . TONSILLECTOMY  1960  . TUBAL LIGATION      Family History  Problem Relation Age of Onset  . Leukemia Brother   . Colon cancer Neg Hx   . Breast cancer Neg Hx     Social History   Social History  . Marital status: Married    Spouse name: N/A  . Number of children: N/A  . Years of education: N/A   Occupational History  . Not on file.   Social History Main Topics  . Smoking status: Former Smoker    Years: 18.00  . Smokeless tobacco: Never Used     Comment: quit 1990  . Alcohol use 0.0 oz/week    5 - 6 Glasses of wine per week      Comment: occ  . Drug use: No  . Sexual activity: No   Other Topics Concern  . Not on file   Social History Narrative   Does have a living will.   Desires CPR, does not want prolonged life support if futile.   The PMH, PSH, Social History, Family History, Medications, and allergies have been reviewed in [facility], and have been updated if relevant.  Review of Systems  Constitutional: Negative.   HENT: Negative.   Eyes: Negative.   Respiratory: Negative.   Cardiovascular: Negative.   Gastrointestinal: Negative.   Endocrine: Negative.   Genitourinary: Negative.   Musculoskeletal: Negative.   Skin: Negative.   Allergic/Immunologic: Negative.   Neurological: Negative.   Hematological: Negative.   Psychiatric/Behavioral: Negative.   All other systems reviewed and are negative.      Objective:    BP 132/78   Pulse 96   Temp 98.6 F (37 C) (Oral)   Ht 5\' 4"  (1.626 m)   Wt 166 lb (75.3 kg)   SpO2 97%   BMI 28.49 kg/m    Physical Exam   General:  Well-developed,well-nourished,in no acute distress; alert,appropriate and cooperative throughout examination Head:  normocephalic and atraumatic.   Eyes:  vision grossly intact, PERRL Ears:  R ear normal and L ear normal externally, TMs clear bilaterally Nose:  no external deformity.   Mouth:  good dentition.   Neck:  No deformities, masses, or tenderness noted. Lungs:  Normal respiratory effort, chest expands symmetrically. Lungs are clear to auscultation, no crackles or wheezes. Heart:  Normal rate and regular rhythm. S1 and S2 normal without gallop, murmur, click, rub or other extra sounds. Abdomen:  Bowel sounds positive,abdomen soft and non-tender without masses, organomegaly or hernias noted. Msk:  No deformity or scoliosis noted of thoracic or lumbar spine.   Extremities:  No clubbing, cyanosis, edema, or deformity noted with normal full range of motion of all joints.   Neurologic:  alert & oriented X3 and gait normal.     Skin:  Intact without suspicious lesions or rashes Psych:  Cognition and judgment appear intact. Alert and cooperative with normal attention span and concentration. No apparent delusions, illusions, hallucinations       Assessment & Plan:   Well woman exam  Osteoporosis, unspecified osteoporosis type, unspecified pathological fracture presence No Follow-up on file.

## 2016-04-11 NOTE — Progress Notes (Signed)
Pre visit review using our clinic review tool, if applicable. No additional management support is needed unless otherwise documented below in the visit note. 

## 2016-04-19 ENCOUNTER — Encounter: Payer: Self-pay | Admitting: Family Medicine

## 2016-04-21 ENCOUNTER — Telehealth: Payer: Self-pay | Admitting: *Deleted

## 2016-04-21 NOTE — Telephone Encounter (Signed)
-----   Message from Lucille Passy, MD sent at 04/20/2016  9:24 AM EST ----- Please see her email and apply for prolia for her. Thank you.

## 2016-04-21 NOTE — Telephone Encounter (Signed)
Information submitted to prolia for approval. Awaiting summary of benefits

## 2016-05-01 ENCOUNTER — Encounter: Payer: Self-pay | Admitting: Family Medicine

## 2016-05-04 ENCOUNTER — Encounter: Payer: Self-pay | Admitting: Family Medicine

## 2016-05-04 ENCOUNTER — Ambulatory Visit (INDEPENDENT_AMBULATORY_CARE_PROVIDER_SITE_OTHER): Payer: PPO | Admitting: Family Medicine

## 2016-05-04 VITALS — BP 108/74 | HR 76 | Resp 20 | Wt 166.0 lb

## 2016-05-04 DIAGNOSIS — R81 Glycosuria: Secondary | ICD-10-CM

## 2016-05-04 DIAGNOSIS — J069 Acute upper respiratory infection, unspecified: Secondary | ICD-10-CM | POA: Diagnosis not present

## 2016-05-04 DIAGNOSIS — R350 Frequency of micturition: Secondary | ICD-10-CM | POA: Diagnosis not present

## 2016-05-04 DIAGNOSIS — J208 Acute bronchitis due to other specified organisms: Secondary | ICD-10-CM

## 2016-05-04 LAB — POC URINALSYSI DIPSTICK (AUTOMATED)
BILIRUBIN UA: NEGATIVE
GLUCOSE UA: 1
KETONES UA: NEGATIVE
Leukocytes, UA: NEGATIVE
Nitrite, UA: NEGATIVE
Protein, UA: NEGATIVE
RBC UA: NEGATIVE
SPEC GRAV UA: 1.02
UROBILINOGEN UA: 0.2
pH, UA: 6

## 2016-05-04 LAB — BASIC METABOLIC PANEL
BUN: 8 mg/dL (ref 6–23)
CO2: 33 mEq/L — ABNORMAL HIGH (ref 19–32)
CREATININE: 0.75 mg/dL (ref 0.40–1.20)
Calcium: 9.5 mg/dL (ref 8.4–10.5)
Chloride: 96 mEq/L (ref 96–112)
GFR: 82.13 mL/min (ref >60.00)
Glucose, Bld: 74 mg/dL (ref 70–99)
POTASSIUM: 3.8 meq/L (ref 3.5–5.1)
Sodium: 134 mEq/L — ABNORMAL LOW (ref 135–145)

## 2016-05-04 LAB — HEMOGLOBIN A1C: Hgb A1c MFr Bld: 6.1 % (ref 4.6–6.5)

## 2016-05-04 MED ORDER — HYDROCODONE-HOMATROPINE 5-1.5 MG/5ML PO SYRP: 5.0000 mL | ORAL_SOLUTION | Freq: Three times a day (TID) | ORAL | 0 refills | Status: DC | PRN

## 2016-05-04 MED ORDER — AZITHROMYCIN 250 MG PO TABS: ORAL_TABLET | ORAL | 0 refills | Status: DC

## 2016-05-04 NOTE — Assessment & Plan Note (Signed)
See above

## 2016-05-04 NOTE — Assessment & Plan Note (Signed)
UA neg for infection but pos for glucose which is odd. Fasting glucose last month was only 88, no h/o diabetes. Will check BMET and a1c today. The patient indicates understanding of these issues and agrees with the plan.

## 2016-05-04 NOTE — Progress Notes (Signed)
Subjective:   Patient ID: Anna Randolph, female    DOB: [birth year], 66 y.o.   MRN: [id number]  Anna Randolph is a pleasant 66 y.o. year old female who presents to clinic today with URI and Urinary Frequency  on 05/04/2016  HPI:  She is pretty sure she had the flu last week.  High fevers, chills, cough. Did not receive tamiflu as it was 48 hours after onset of symptoms by the time she emailed [place].  Over past few days, symptoms have deteriorated, SOB, cough is now productive and very painful.  This morning, felt some urinary urgency and frequency without dysuria.  Current Outpatient Prescriptions on File Prior to Visit  Medication Sig Dispense Refill  . alendronate (FOSAMAX) 70 MG tablet Take 1 tablet (70 mg total) by mouth every 7 (seven) days. Take with a full glass of water on an empty stomach. (Patient not taking: Reported on 05/04/2016) 4 tablet 11  . aspirin 81 MG tablet Take 81 mg by mouth daily.    . Cholecalciferol (VITAMIN D3) 2000 UNITS TABS Take 1 Units by mouth.    . Coenzyme Q10 (CO Q 10) 10 MG CAPS Take 1 tablet by mouth daily.    Marland Kitchen escitalopram (LEXAPRO) 5 MG tablet Take 1 tablet (5 mg total) by mouth daily. 30 tablet 6  . Garlic 123XX123 MG CAPS Take 1 capsule by mouth daily.    Marland Kitchen lactobacillus acidophilus (BACID) TABS Take 1 tablet by mouth daily as needed. Reported on 06/02/2015    . loratadine (CLARITIN) 10 MG tablet Take 10 mg by mouth daily.    . Magnesium 250 MG TABS Take 400 mg by mouth daily.     . rosuvastatin (CRESTOR) 10 MG tablet TAKE 1 TABLET BY MOUTH DAILY 90 tablet 2  . Turmeric 500 MG CAPS Take 1 capsule by mouth daily.     No current facility-administered medications on file prior to visit.     Allergies  Allergen Reactions  . Sulfa Antibiotics Anaphylaxis    Throat Swells  . Sulfonamide Derivatives Anaphylaxis    Throat Swells  . Keflex [Cephalexin] Other (See Comments)    Sores in mouth  . Lipitor [Atorvastatin] Other (See Comments)    ?  Passed out  . Latex Rash  . Tape Rash    Adhesive bandages    Past Medical History:  Diagnosis Date  . Breast cancer ([place]) 2010   right breast lumpectomy with chemo and rad tx  . Cancer [facility]) 2010   right lumpectomy with chemo and radiation, Triple negative  . Cancer of right breast, stage 1, estrogen receptor negative ([place]) 09/16/2014  . Fatty liver 2016   by CT  . Malignant neoplasm of upper-outer quadrant of female breast ([place])   . Mammographic microcalcification 2013  . Meningitis as infant  . Personal history of chemotherapy 2010   BREAST CA  . Personal history of malignant neoplasm of breast   . Personal history of radiation therapy 2010   BREAST CA  . Personal history of tobacco use, presenting hazards to health   . Shingles 2014  . Special screening for malignant neoplasms, colon   . Thoracic aorta atherosclerosis ([place]) 2016   by CT    Past Surgical History:  Procedure Laterality Date  . ABDOMINAL HYSTERECTOMY  1995  . BREAST BIOPSY Right Jan 2014   stereo negative  . BREAST EXCISIONAL BIOPSY Right 2010   lumptectomy chemo and rad  . BREAST LUMPECTOMY Right 2010  sentinel node bx  . COLONOSCOPY  [id number]   Dr. Vira Agar  . PORT-A-CATH REMOVAL  04/2010  . PORTACATH PLACEMENT  2010   placement  . TONSILLECTOMY  1960  . TUBAL LIGATION      Family History  Problem Relation Age of Onset  . Leukemia Brother   . Colon cancer Neg Hx   . Breast cancer Neg Hx     Social History   Social History  . Marital status: Married    Spouse name: N/A  . Number of children: N/A  . Years of education: N/A   Occupational History  . Not on file.   Social History Main Topics  . Smoking status: Former Smoker    Years: 18.00  . Smokeless tobacco: Never Used     Comment: quit 1990  . Alcohol use 0.0 oz/week    5 - 6 Glasses of wine per week     Comment: occ  . Drug use: No  . Sexual activity: No   Other Topics Concern  . Not on file   Social History Narrative    Does have a living will.   Desires CPR, does not want prolonged life support if futile.   The PMH, PSH, Social History, Family History, Medications, and allergies have been reviewed in [facility], and have been updated if relevant.   Review of Systems  Constitutional: Positive for fatigue and fever.  HENT: Negative.   Respiratory: Positive for cough and shortness of breath. Negative for choking and wheezing.   Cardiovascular: Negative.   Endocrine: Negative for polydipsia, polyphagia and polyuria.  Genitourinary: Positive for frequency and urgency. Negative for dysuria and hematuria.  Neurological: Negative.   All other systems reviewed and are negative.      Objective:    BP 108/74 (BP Location: Left Arm, Patient Position: Sitting, Cuff Size: Normal)   Pulse 76   Resp 20   Wt 166 lb (75.3 kg)   BMI 28.49 kg/m    Physical Exam  Constitutional: She is oriented to person, place, and time. She appears well-developed and well-nourished. No distress.  HENT:  Head: Normocephalic and atraumatic.  Eyes: Conjunctivae are normal.  Cardiovascular: Normal rate and regular rhythm.   Pulmonary/Chest: No tachypnea. No respiratory distress. She has wheezes in the left lower field. She has rhonchi in the left lower field.  Neurological: She is alert and oriented to person, place, and time. No cranial nerve deficit.  Skin: Skin is warm and dry. She is not diaphoretic.  Psychiatric: She has a normal mood and affect. Her behavior is normal. Judgment and thought content normal.  Nursing note and vitals reviewed.         Assessment & Plan:   Urinary frequency - Plan: POCT Urinalysis Dipstick (Automated)  Upper respiratory tract infection, unspecified type  Acute viral bronchitis - Plan: HYDROcodone-homatropine (HYCODAN) 5-1.5 MG/5ML syrup No Follow-up on file.

## 2016-05-04 NOTE — Assessment & Plan Note (Signed)
Concerning for post flu pneumonia. Multiple abx allergies. Will treat with zpack. Rx printed for hycodan to use for severe cough. Call or return to clinic prn if these symptoms worsen or fail to improve as anticipated. The patient indicates understanding of these issues and agrees with the plan.

## 2016-05-08 NOTE — Telephone Encounter (Signed)
PA required. Additional information submitted for review. Please do not contact pt until approval received. If approved, see below   Verification of benefits have been processed and an approval has been received for pts prolia injection. Pts estimated cost are appx $220 with an OV or $210 with a nurse visit. This is only an estimate and cannot be confirmed until benefits are paid. Please advise pt and schedule if needed. If scheduled, once the injection is received, pls contact me back with the date it was received so that I am able to update prolia folder. thanks

## 2016-05-12 ENCOUNTER — Encounter: Payer: Self-pay | Admitting: Family Medicine

## 2016-05-12 ENCOUNTER — Telehealth: Payer: Self-pay | Admitting: Family Medicine

## 2016-05-12 ENCOUNTER — Ambulatory Visit (INDEPENDENT_AMBULATORY_CARE_PROVIDER_SITE_OTHER): Payer: PPO | Admitting: Family Medicine

## 2016-05-12 DIAGNOSIS — J301 Allergic rhinitis due to pollen: Secondary | ICD-10-CM

## 2016-05-12 DIAGNOSIS — J189 Pneumonia, unspecified organism: Secondary | ICD-10-CM | POA: Insufficient documentation

## 2016-05-12 DIAGNOSIS — J181 Lobar pneumonia, unspecified organism: Secondary | ICD-10-CM | POA: Diagnosis not present

## 2016-05-12 NOTE — Patient Instructions (Signed)
Continue Singulair, add claritin regularly. Nasal saline spray 2-3  Times a day.   Can try adding 2 sprays per nostril daily of flonase OTC.  Rest, fluids.

## 2016-05-12 NOTE — Telephone Encounter (Signed)
Lm on pts vm requesting a call back 

## 2016-05-12 NOTE — Assessment & Plan Note (Signed)
Resolved per exam s/p antibitoics.

## 2016-05-12 NOTE — Progress Notes (Signed)
   Subjective:    Patient ID: Anna Randolph, female    DOB: December 27, 1950, 66 y.o.   MRN: [id number]  HPI   66 year old female presents for follow up cough At end of Feb. For nontest proven flu, did not receive tamiflu With continued cough on follow up with  PCP Dr Deborra Medina on 3/1 she was diagnosed with post flu pneumonia. Treated with Z-pack and hycodan cough suppressant.  No CXR done.  Today she reports since antibiotics she felt better initially.IN last few days she has been more congestion in nose and has also felt mildly short of breath.  No wheeze. No fever on antibiotic or after.  She has sneezed a lot. No chest pain, but does feel some chest tightness.  no body ache, but has headache, still feeling fatigued. No ear pain, no face pain. Ears feel full.  her ex husband passed away recently and she had to be in his apartment where her smoker... Allergy to smoke.  Has recently been using singulair, claritin. Nasal saline spray. Aleve off and on.  Blood pressure 128/84, pulse 96, temperature 98.6 F (37 C), temperature source Oral, height 5\' 4"  (1.626 m), SpO2 96 %.    Review of Systems  Constitutional: Positive for fatigue. Negative for fever.  HENT: Negative for ear pain.   Eyes: Negative for pain.  Respiratory: Positive for cough. Negative for wheezing.   Cardiovascular: Negative for chest pain, palpitations and leg swelling.  Gastrointestinal: Negative for abdominal distention.       Objective:   Physical Exam  Constitutional: Vital signs are normal. She appears well-developed and well-nourished. She is cooperative.  Non-toxic appearance. She does not appear ill. No distress.  HENT:  Head: Normocephalic.  Right Ear: Hearing, external ear and ear canal normal. Tympanic membrane is not erythematous, not retracted and not bulging. A middle ear effusion is present.  Left Ear: Hearing, external ear and ear canal normal. Tympanic membrane is not erythematous, not retracted and  not bulging. A middle ear effusion is present.  Nose: Mucosal edema and rhinorrhea present. Right sinus exhibits no maxillary sinus tenderness and no frontal sinus tenderness. Left sinus exhibits no maxillary sinus tenderness and no frontal sinus tenderness.  Mouth/Throat: Uvula is midline, oropharynx is clear and moist and mucous membranes are normal.  Eyes: Conjunctivae, EOM and lids are normal. Pupils are equal, round, and reactive to light. Lids are everted and swept, no foreign bodies found.  Neck: Trachea normal and normal range of motion. Neck supple. Carotid bruit is not present. No thyroid mass and no thyromegaly present.  Cardiovascular: Normal rate, regular rhythm, S1 normal, S2 normal, normal heart sounds, intact distal pulses and normal pulses.  Exam reveals no gallop and no friction rub.   No murmur heard. Pulmonary/Chest: Effort normal and breath sounds normal. No tachypnea. No respiratory distress. She has no decreased breath sounds. She has no wheezes. She has no rhonchi. She has no rales.  Abdominal: Soft. Normal appearance and bowel sounds are normal. There is no tenderness.  Neurological: She is alert.  Skin: Skin is warm, dry and intact. No rash noted.  Psychiatric: Her speech is normal and behavior is normal. Judgment and thought content normal. Her mood appears not anxious. Cognition and memory are normal. She does not exhibit a depressed mood.          Assessment & Plan:

## 2016-05-12 NOTE — Telephone Encounter (Signed)
PT questioned when she would find out about Prolia. [place]

## 2016-05-12 NOTE — Progress Notes (Signed)
Pre visit review using our clinic review tool, if applicable. No additional management support is needed unless otherwise documented below in the visit note. 

## 2016-05-12 NOTE — Telephone Encounter (Signed)
Detailed message left on pts vm and advised, unfortunately, we are not aware of the time frame for approval/denial of prolia. Pt advised once benefits are received, she will be contacted to discuss.

## 2016-05-12 NOTE — Telephone Encounter (Signed)
Patient returned [facility] call.  Patient said if she doesn't answer, to leave a detailed message on her voice mail.  Please call patient back at  [phone].

## 2016-05-12 NOTE — Assessment & Plan Note (Signed)
Symptoms currently consistent with allergy flare. Add antihistamine to singulair and trial of flonase along with nasal saline,

## 2016-05-17 ENCOUNTER — Other Ambulatory Visit: Payer: Self-pay | Admitting: Family Medicine

## 2016-05-17 DIAGNOSIS — R81 Glycosuria: Secondary | ICD-10-CM

## 2016-05-25 ENCOUNTER — Other Ambulatory Visit (INDEPENDENT_AMBULATORY_CARE_PROVIDER_SITE_OTHER): Payer: PPO

## 2016-05-25 DIAGNOSIS — R81 Glycosuria: Secondary | ICD-10-CM

## 2016-05-25 LAB — URINALYSIS
Bilirubin Urine: NEGATIVE
HGB URINE DIPSTICK: NEGATIVE
LEUKOCYTES UA: NEGATIVE
NITRITE: NEGATIVE
Specific Gravity, Urine: 1.025 (ref 1.000–1.030)
Urine Glucose: NEGATIVE
Urobilinogen, UA: 0.2 (ref 0.0–1.0)
pH: 5.5 (ref 5.0–8.0)

## 2016-05-25 LAB — COMPREHENSIVE METABOLIC PANEL
ALBUMIN: 4.5 g/dL (ref 3.5–5.2)
ALK PHOS: 80 U/L (ref 39–117)
ALT: 28 U/L (ref 0–35)
AST: 24 U/L (ref 0–37)
BUN: 12 mg/dL (ref 6–23)
CALCIUM: 9.8 mg/dL (ref 8.4–10.5)
CO2: 28 mEq/L (ref 19–32)
Chloride: 103 mEq/L (ref 96–112)
Creatinine, Ser: 0.82 mg/dL (ref 0.40–1.20)
GFR: 74.09 mL/min (ref >60.00)
Glucose, Bld: 105 mg/dL — ABNORMAL HIGH (ref 70–99)
POTASSIUM: 4.6 meq/L (ref 3.5–5.1)
Sodium: 139 mEq/L (ref 135–145)
TOTAL PROTEIN: 7.7 g/dL (ref 6.0–8.3)
Total Bilirubin: 0.5 mg/dL (ref 0.2–1.2)

## 2016-05-25 LAB — HEMOGLOBIN A1C: Hgb A1c MFr Bld: 6.1 % (ref 4.6–6.5)

## 2016-05-26 ENCOUNTER — Encounter: Payer: Self-pay | Admitting: Family Medicine

## 2016-06-01 NOTE — Telephone Encounter (Signed)
Called pt and LVM about prolia shot and call the  office back

## 2016-06-01 NOTE — Telephone Encounter (Signed)
Verification of benefits have been processed and an approval has been received for pts prolia injection. Pts estimated cost are appx $220 with an office visit and $210 without. This is only an estimate and cannot be confirmed until benefits are paid. Please advise pt and schedule if needed. If scheduled, once the injection is received, pls contact me back with the date it was received so that I am able to update prolia folder. thanks   [place] to schedule Ca lab and injection 1wk following if pt is agreeable

## 2016-06-05 ENCOUNTER — Telehealth: Payer: Self-pay | Admitting: *Deleted

## 2016-06-05 NOTE — Telephone Encounter (Signed)
Spoke with patient & shared information about her Prolia injections. Patient stated she would call her insurance co for more detailed information concerning cost & would then call back .

## 2016-08-23 NOTE — Telephone Encounter (Signed)
Pt was contacted previously and lmom (see additional phone note.) Pt never contacted office back and no additional attempts were made to reach her. 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 Prolia book has been updated. FYI to Dr Deborra Medina

## 2016-09-05 DIAGNOSIS — H1045 Other chronic allergic conjunctivitis: Secondary | ICD-10-CM | POA: Diagnosis not present

## 2016-09-05 DIAGNOSIS — J300 Vasomotor rhinitis: Secondary | ICD-10-CM | POA: Diagnosis not present

## 2016-09-05 DIAGNOSIS — R05 Cough: Secondary | ICD-10-CM | POA: Diagnosis not present

## 2016-09-06 IMAGING — MR MRI HEAD WITHOUT AND WITH CONTRAST
10 of 11 series · 39 of 48 positions shown · IV contrast (multihance)
Comparison: None.

CLINICAL DATA: 64-year-old female with headache on the right,
behind the right eye. Some vertigo. Right eye visual changes,
blurred vision. Symptoms for 3-4 months. Personal history of breast
cancer. Subsequent encounter.

EXAM:
MRI HEAD WITHOUT AND WITH CONTRAST
TECHNIQUE: Multiplanar, multiecho pulse sequences of the brain and surrounding
structures were obtained without and with intravenous contrast.
CONTRAST:  14 mL MultiHance.

[Series 2: T1 · sagittal · 5.0mm · 0.45mm/px · 1 of 25 slices shown]
[im 1/25]
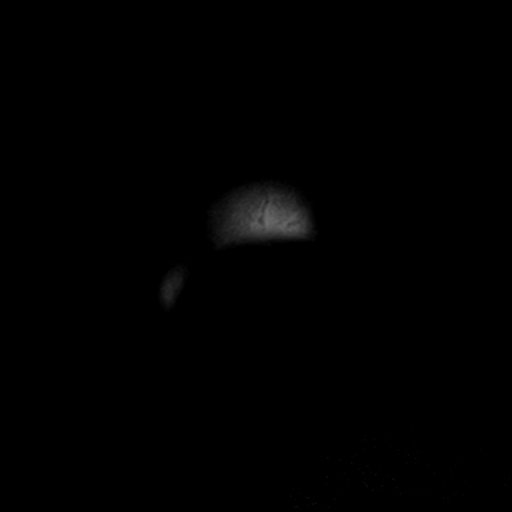

[Series 4: DWI · axial · 3.0mm · 1.20mm/px · z∈[-50,+119]mm · 7 of 58 slices shown (1 of 2)]
[im 1/58]
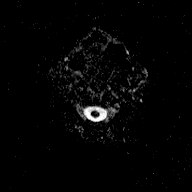
[im 10/58]
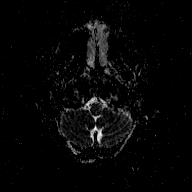
[im 20/58]
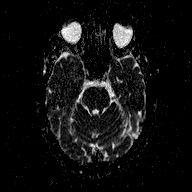
[im 29/58]
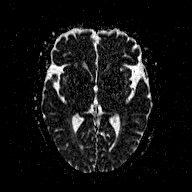
[im 39/58]
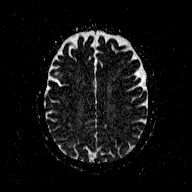
[im 48/58]
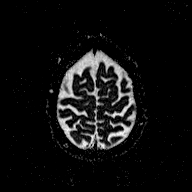
[im 58/58]
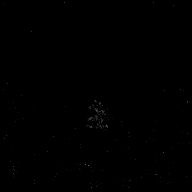

[Series 5: T2 · axial · 5.0mm · 0.72mm/px · z∈[-52,+122]mm · 3 of 28 slices shown (1 of 2)]
[im 1/28]
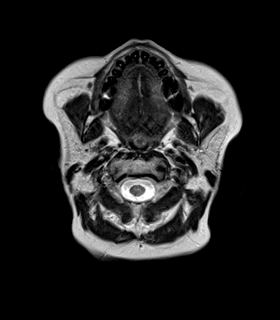
[im 14/28]
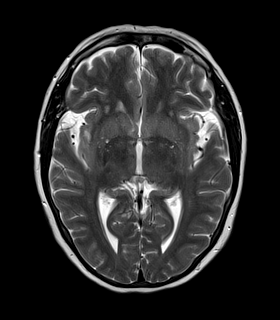
[im 28/28]
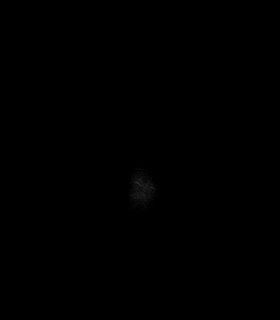

[Series 6: FLAIR · axial · 5.0mm · 0.45mm/px · z∈[-52,+122]mm · 3 of 28 slices shown]
[im 1/28]
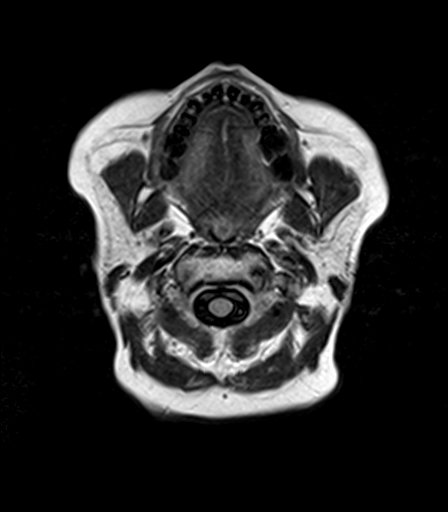
[im 14/28]
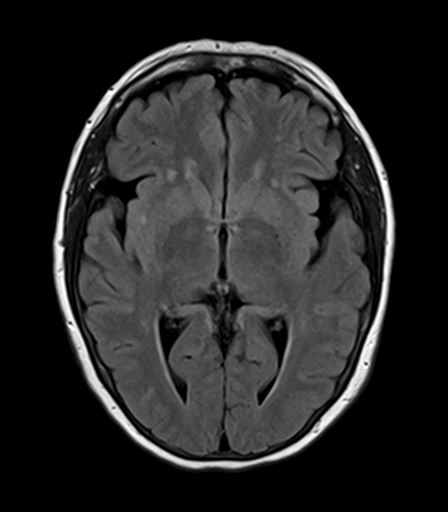
[im 28/28]
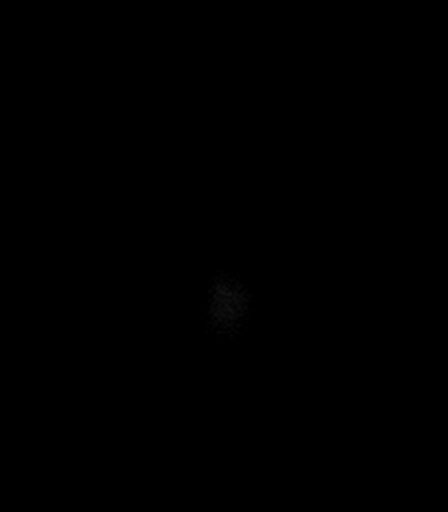

[Series 7: T2 · axial · 5.0mm · 0.72mm/px · z∈[-52,+122]mm · 3 of 28 slices shown (2 of 2)]
[im 1/28]
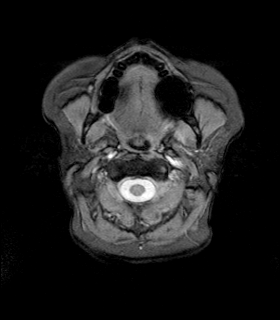
[im 14/28]
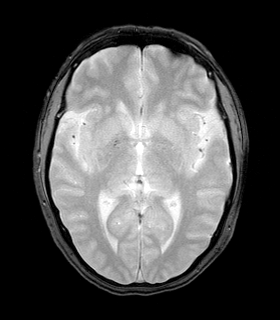
[im 28/28]
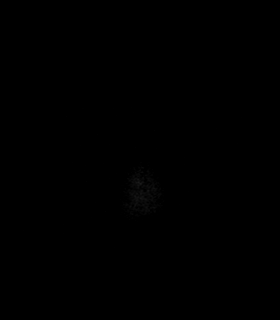

[Series 9: T2 post-contrast · coronal · 5.0mm · 0.45mm/px · 3 of 30 slices shown]
[im 1/30]
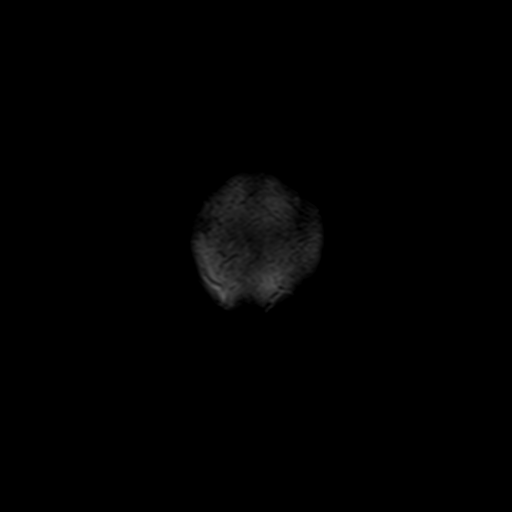
[im 15/30]
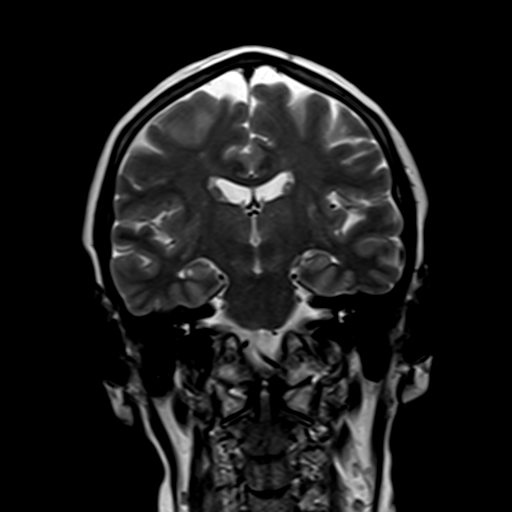
[im 30/30]
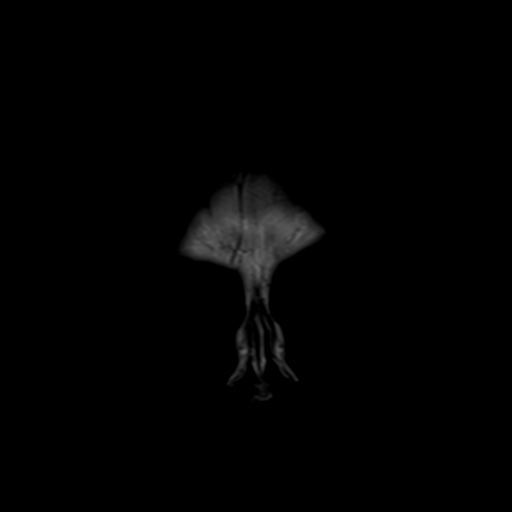

[Series 10: T1 post-contrast · axial · 3.0mm · 1.00mm/px · z∈[-45,+118]mm · 6 of 56 slices shown (1 of 3)]
[im 1/56]
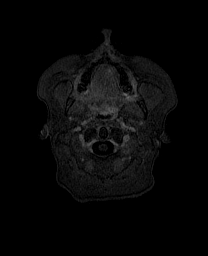
[im 12/56]
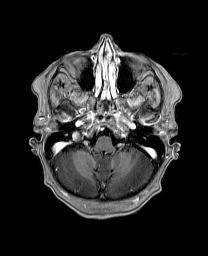
[im 23/56]
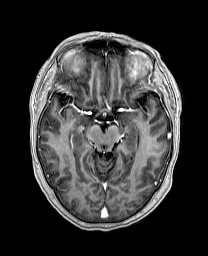
[im 34/56]
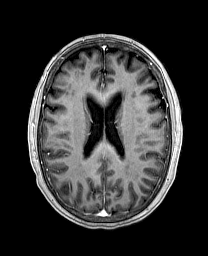
[im 45/56]
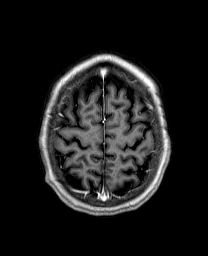
[im 56/56]
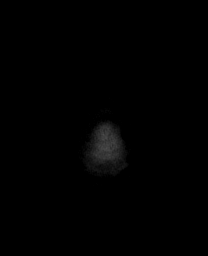

[Series 11: T1 post-contrast · coronal · 5.0mm · 0.45mm/px · 3 of 30 slices shown (2 of 3)]
[im 1/30]
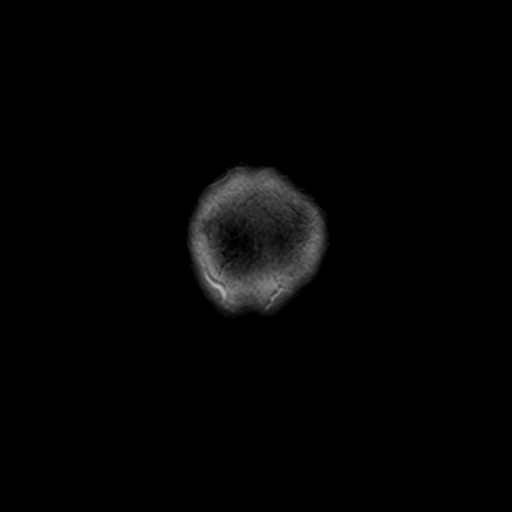
[im 15/30]
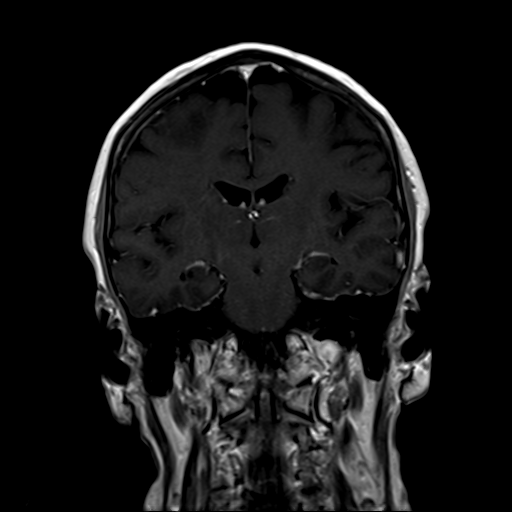
[im 30/30]
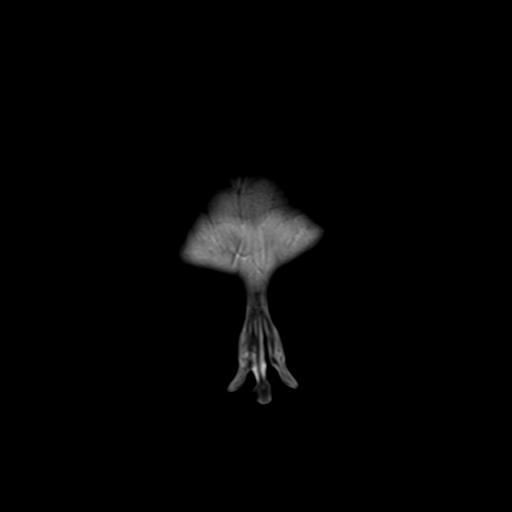

[Series 12: T1 post-contrast · sagittal · 5.0mm · 0.45mm/px · 3 of 25 slices shown (3 of 3)]
[im 1/25]
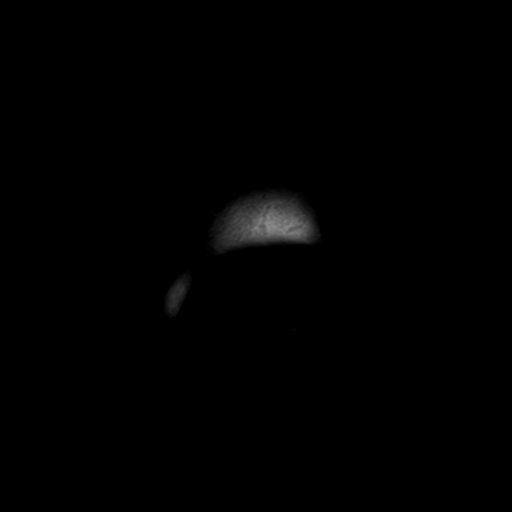
[im 13/25]
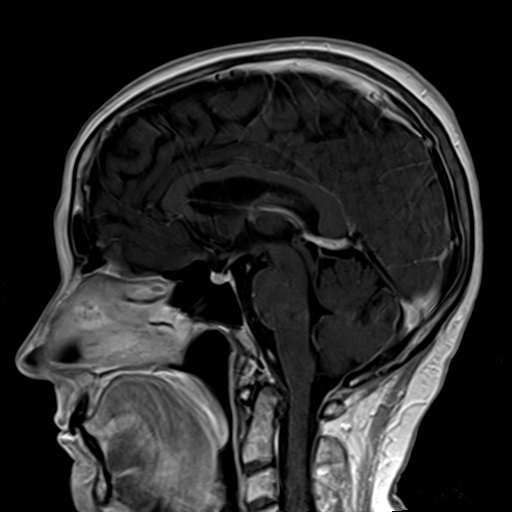
[im 25/25]
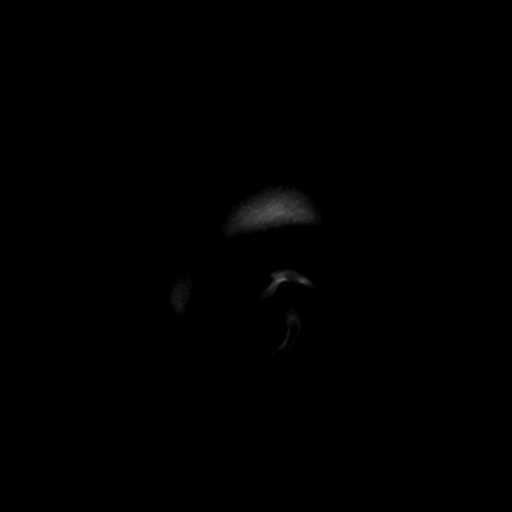

[Series 100: DWI · axial · 3.0mm · 1.20mm/px · z∈[-50,+119]mm · 7 of 58 slices shown (2 of 2)]
[im 1/58]
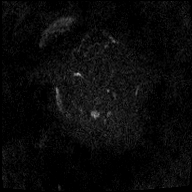
[im 10/58]
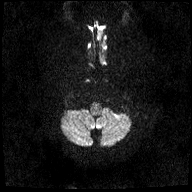
[im 20/58]
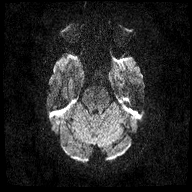
[im 29/58]
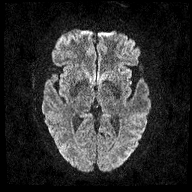
[im 39/58]
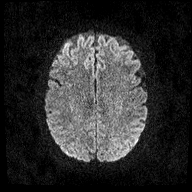
[im 48/58]
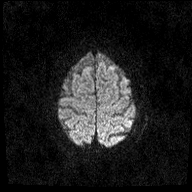
[im 58/58]
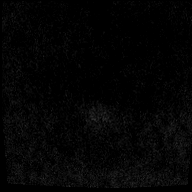

[39 of 48 positions shown; findings below may reference images not displayed]

FINDINGS: Vascular pulsation artifact on post-contrast sagittal and coronal
images affecting the posterior fossa. No abnormal enhancement
identified. No dural thickening identified. Visualized orbit soft
tissues are within normal limits. Visualized bone marrow signal is
within normal limits.

Cerebral volume is within normal limits for age. No restricted
diffusion to suggest acute infarction. No midline shift, mass
effect, evidence of mass lesion, ventriculomegaly, extra-axial
collection or acute intracranial hemorrhage. Cervicomedullary
junction and pituitary are within normal limits. Major intracranial
vascular flow voids are preserved.

Scattered mostly subcentimeter foci of cerebral white matter T2 and
FLAIR hyperintensity in both hemispheres, nonspecific. Extent is new
moderate for age. No cortical encephalomalacia. Deep gray matter
nuclei, brainstem and cerebellum within normal limits. Visible
internal auditory structures appear normal. Trace bilateral inferior
mastoid air cell fluid, significance doubtful. Otherwise Visualized
paranasal sinuses and mastoids are clear. Visualized scalp soft
tissues are within normal limits.
IMPRESSION: 1.  No acute or metastatic intracranial abnormality.
2. Moderate for age nonspecific cerebral white matter signal
changes, most commonly due to chronic small vessel disease.

## 2016-09-14 ENCOUNTER — Other Ambulatory Visit: Payer: PPO

## 2016-09-14 ENCOUNTER — Ambulatory Visit: Payer: PPO | Admitting: Internal Medicine

## 2016-09-22 ENCOUNTER — Other Ambulatory Visit: Payer: Self-pay | Admitting: *Deleted

## 2016-09-22 DIAGNOSIS — Z853 Personal history of malignant neoplasm of breast: Secondary | ICD-10-CM

## 2016-09-25 ENCOUNTER — Inpatient Hospital Stay (HOSPITAL_BASED_OUTPATIENT_CLINIC_OR_DEPARTMENT_OTHER): Payer: PPO | Admitting: Internal Medicine

## 2016-09-25 ENCOUNTER — Inpatient Hospital Stay: Payer: PPO | Attending: Internal Medicine

## 2016-09-25 DIAGNOSIS — Z8661 Personal history of infections of the central nervous system: Secondary | ICD-10-CM | POA: Insufficient documentation

## 2016-09-25 DIAGNOSIS — R030 Elevated blood-pressure reading, without diagnosis of hypertension: Secondary | ICD-10-CM | POA: Insufficient documentation

## 2016-09-25 DIAGNOSIS — K760 Fatty (change of) liver, not elsewhere classified: Secondary | ICD-10-CM

## 2016-09-25 DIAGNOSIS — Z87891 Personal history of nicotine dependence: Secondary | ICD-10-CM | POA: Insufficient documentation

## 2016-09-25 DIAGNOSIS — I700 Atherosclerosis of aorta: Secondary | ICD-10-CM | POA: Diagnosis not present

## 2016-09-25 DIAGNOSIS — Z7982 Long term (current) use of aspirin: Secondary | ICD-10-CM | POA: Diagnosis not present

## 2016-09-25 DIAGNOSIS — M810 Age-related osteoporosis without current pathological fracture: Secondary | ICD-10-CM | POA: Diagnosis not present

## 2016-09-25 DIAGNOSIS — Z9221 Personal history of antineoplastic chemotherapy: Secondary | ICD-10-CM | POA: Diagnosis not present

## 2016-09-25 DIAGNOSIS — Z806 Family history of leukemia: Secondary | ICD-10-CM

## 2016-09-25 DIAGNOSIS — Z853 Personal history of malignant neoplasm of breast: Secondary | ICD-10-CM

## 2016-09-25 DIAGNOSIS — C50811 Malignant neoplasm of overlapping sites of right female breast: Secondary | ICD-10-CM

## 2016-09-25 DIAGNOSIS — Z171 Estrogen receptor negative status [ER-]: Principal | ICD-10-CM

## 2016-09-25 DIAGNOSIS — Z923 Personal history of irradiation: Secondary | ICD-10-CM | POA: Insufficient documentation

## 2016-09-25 DIAGNOSIS — Z79899 Other long term (current) drug therapy: Secondary | ICD-10-CM

## 2016-09-25 LAB — COMPREHENSIVE METABOLIC PANEL
ALBUMIN: 4.3 g/dL (ref 3.5–5.0)
ALT: 28 U/L (ref 14–54)
ANION GAP: 8 (ref 5–15)
AST: 30 U/L (ref 15–41)
Alkaline Phosphatase: 76 U/L (ref 38–126)
BUN: 16 mg/dL (ref 6–20)
CHLORIDE: 104 mmol/L (ref 101–111)
CO2: 26 mmol/L (ref 22–32)
Calcium: 9.2 mg/dL (ref 8.9–10.3)
Creatinine, Ser: 0.74 mg/dL (ref 0.44–1.00)
GFR calc Af Amer: >60 mL/min (ref >60)
GLUCOSE: 104 mg/dL — AB (ref 65–99)
POTASSIUM: 4.4 mmol/L (ref 3.5–5.1)
Sodium: 138 mmol/L (ref 135–145)
Total Bilirubin: 0.4 mg/dL (ref 0.3–1.2)
Total Protein: 7.5 g/dL (ref 6.5–8.1)

## 2016-09-25 LAB — CBC WITH DIFFERENTIAL/PLATELET
Basophils Absolute: 0 10*3/uL (ref 0–0.1)
Basophils Relative: 1 %
Eosinophils Absolute: 0.1 10*3/uL (ref 0–0.7)
Eosinophils Relative: 2 %
HCT: 42.2 % (ref 35.0–47.0)
HEMOGLOBIN: 14.8 g/dL (ref 12.0–16.0)
LYMPHS ABS: 1.5 10*3/uL (ref 1.0–3.6)
LYMPHS PCT: 23 %
MCH: 31.9 pg (ref 26.0–34.0)
MCHC: 35.1 g/dL (ref 32.0–36.0)
MCV: 91.1 fL (ref 80.0–100.0)
Monocytes Absolute: 0.6 10*3/uL (ref 0.2–0.9)
Monocytes Relative: 10 %
NEUTROS PCT: 66 %
Neutro Abs: 4.2 10*3/uL (ref 1.4–6.5)
Platelets: 253 10*3/uL (ref 150–440)
RBC: 4.63 MIL/uL (ref 3.80–5.20)
RDW: 12.9 % (ref 11.5–14.5)
WBC: 6.4 10*3/uL (ref 3.6–11.0)

## 2016-09-25 NOTE — Progress Notes (Signed)
Patient here for breast cancer follow-up. She has no medical complaints today.

## 2016-09-25 NOTE — Progress Notes (Signed)
[place] OFFICE PROGRESS NOTE  Patient Care Team: Lucille Passy, MD as PCP - General (Family Medicine) Christene Lye, MD (General Surgery) Forest Gleason, MD (Oncology) Mosetta Anis, MD as Referring Physician (Allergy)  Cancer Staging No matching staging information was found for the patient.   Oncology History   # 2010-  stage I pathologic (T1 C. 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 Status post wide local excision and sentinel node biopsy.  Triple negative disease; 2. Status post chemotherapy with Cytoxan/adriamycin  followed by Taxol and radiation therapy.  # No family Hx [no genetic testing]; Dx < 30 y- meets NCCN guidelines; done July 23rd 2018  # Feb 2018- BMD: - 2.5/ osteoporosis. Wants to wait for prolia.       Carcinoma of overlapping sites of right breast in female, estrogen receptor negative ([place])     This is my first interaction with the patient as patient's primary oncologist has been Dr.Choksi. I reviewed the patient's prior charts/pertinent labs/imaging in detail; findings are summarized above.    INTERVAL HISTORY:  Anna Randolph 67 y.o.  female pleasant patient above history of Stage I ER/P/HER-2/neu negative breast cancer status post lumpectomy followed by adjuvant chemotherapy is here for follow-up.  Patient denies any new lumps or bumps. Her appetite is good. No headaches. No bone pain.   She had been under a lot of stress- given the death of her ex-husband who had colon cancer.   REVIEW OF SYSTEMS:  A complete 10 point review of system is done which is negative except mentioned above/history of present illness.   PAST MEDICAL HISTORY :  Past Medical History:  Diagnosis Date  . Breast cancer ([place]) 2010   right breast lumpectomy with chemo and rad tx  . Cancer [facility]) 2010   right lumpectomy with chemo and radiation, Triple negative  . Cancer of right breast, stage 1,  estrogen receptor negative ([place]) 09/16/2014  . Fatty liver 2016   by CT  . Malignant neoplasm of upper-outer quadrant of female breast ([place])   . Mammographic microcalcification 2013  . Meningitis as infant  . Personal history of chemotherapy 2010   BREAST CA  . Personal history of malignant neoplasm of breast   . Personal history of radiation therapy 2010   BREAST CA  . Personal history of tobacco use, presenting hazards to health   . Shingles 2014  . Special screening for malignant neoplasms, colon   . Thoracic aorta atherosclerosis ([place]) 2016   by CT    PAST SURGICAL HISTORY :   Past Surgical History:  Procedure Laterality Date  . ABDOMINAL HYSTERECTOMY  1995  . BREAST BIOPSY Right Jan 2014   stereo negative  . BREAST EXCISIONAL BIOPSY Right 2010   lumptectomy chemo and rad  . BREAST LUMPECTOMY Right 2010   sentinel node bx  . COLONOSCOPY  [id number]   Dr. Vira Agar  . PORT-A-CATH REMOVAL  04/2010  . PORTACATH PLACEMENT  2010   placement  . TONSILLECTOMY  1960  . TUBAL LIGATION      FAMILY HISTORY :   Family History  Problem Relation Age of Onset  . Leukemia Brother   . Colon cancer Neg Hx   . Breast cancer Neg Hx     SOCIAL HISTORY:   Social History  Substance Use Topics  . Smoking status: Former Smoker    Years: 18.00  . Smokeless tobacco: Never Used  Comment: quit 1990  . Alcohol use 0.0 oz/week    5 - 6 Glasses of wine per week     Comment: occ    ALLERGIES:  is allergic to sulfa antibiotics; sulfonamide derivatives; keflex [cephalexin]; lipitor [atorvastatin]; latex; and tape.  MEDICATIONS:  Current Outpatient Prescriptions  Medication Sig Dispense Refill  . aspirin 81 MG tablet Take 81 mg by mouth daily.    . Cholecalciferol (VITAMIN D3) 2000 UNITS TABS Take 1 Units by mouth.    . Coenzyme Q10 (CO Q 10) 10 MG CAPS Take 1 tablet by mouth daily.    Marland Kitchen escitalopram (LEXAPRO) 5 MG tablet Take 1 tablet (5 mg total) by mouth daily. 30 tablet 6  .  fluticasone (FLONASE) 50 MCG/ACT nasal spray Place 1 spray into both nostrils daily.     . Garlic 6720 MG CAPS Take 1 capsule by mouth daily.    Marland Kitchen loratadine (CLARITIN) 10 MG tablet Take 10 mg by mouth daily.    . Magnesium 250 MG TABS Take 400 mg by mouth daily.     . montelukast (SINGULAIR) 10 MG tablet Take 10 mg by mouth at bedtime.     . rosuvastatin (CRESTOR) 10 MG tablet TAKE 1 TABLET BY MOUTH DAILY 90 tablet 2  . Turmeric 500 MG CAPS Take 1 capsule by mouth daily.    Marland Kitchen azelastine (OPTIVAR) 0.05 % ophthalmic solution Place 1 drop into both eyes daily as needed (dry eyes).     . lactobacillus acidophilus (BACID) TABS Take 1 tablet by mouth daily as needed. Reported on 06/02/2015    . PROAIR RESPICLICK 947 (90 Base) MCG/ACT AEPB Inhale 1 puff into the lungs as needed (shortness of breath).      No current facility-administered medications for this visit.     PHYSICAL EXAMINATION: ECOG PERFORMANCE STATUS: 0 - Asymptomatic  BP (!) 182/94   Pulse 82   Temp 97.8 F (36.6 C) (Tympanic)   Resp 20   Ht 5' 4"  (1.626 m)   Wt 167 lb (75.8 kg)   BMI 28.67 kg/m   Filed Weights   09/25/16 0922  Weight: 167 lb (75.8 kg)    GENERAL: Well-nourished well-developed; Alert, no distress and comfortable.   Alone. EYES: no pallor or icterus OROPHARYNX: no thrush or ulceration; good dentition  NECK: supple, no masses felt LYMPH:  no palpable lymphadenopathy in the cervical, axillary or inguinal regions LUNGS: clear to auscultation and  No wheeze or crackles HEART/CVS: regular rate & rhythm and no murmurs; No lower extremity edema ABDOMEN:abdomen soft, non-tender and normal bowel sounds Musculoskeletal:no cyanosis of digits and no clubbing  PSYCH: alert & oriented x 3 with fluent speech NEURO: no focal motor/sensory deficits SKIN:  no rashes or significant lesions Right and left BREAST exam [in the presence of nurse]- no unusual skin changes or dominant masses felt. Surgical scars noted.     LABORATORY DATA:  I have reviewed the data as listed    Component Value Date/Time   NA 138 09/25/2016 0858   NA 139 03/16/2014 1451   K 4.4 09/25/2016 0858   K 3.7 03/16/2014 1451   CL 104 09/25/2016 0858   CL 101 03/16/2014 1451   CO2 26 09/25/2016 0858   CO2 29 03/16/2014 1451   GLUCOSE 104 (H) 09/25/2016 0858   GLUCOSE 96 03/16/2014 1451   BUN 16 09/25/2016 0858   BUN 14 03/16/2014 1451   CREATININE 0.74 09/25/2016 0858   CREATININE 1.09 03/16/2014 1451  CALCIUM 9.2 09/25/2016 0858   CALCIUM 8.9 03/16/2014 1451   PROT 7.5 09/25/2016 0858   PROT 7.5 03/16/2014 1451   ALBUMIN 4.3 09/25/2016 0858   ALBUMIN 3.9 03/16/2014 1451   AST 30 09/25/2016 0858   AST 24 03/16/2014 1451   ALT 28 09/25/2016 0858   ALT 36 03/16/2014 1451   ALKPHOS 76 09/25/2016 0858   ALKPHOS 89 03/16/2014 1451   BILITOT 0.4 09/25/2016 0858   BILITOT 0.2 03/16/2014 1451   GFRNONAA >60 09/25/2016 0858   GFRNONAA 54 (L) 03/16/2014 1451   GFRNONAA 60 (L) 09/01/2013 1416   GFRAA >60 09/25/2016 0858   GFRAA >60 03/16/2014 1451   GFRAA >60 09/01/2013 1416    No results found for: SPEP, UPEP  Lab Results  Component Value Date   WBC 6.4 09/25/2016   NEUTROABS 4.2 09/25/2016   HGB 14.8 09/25/2016   HCT 42.2 09/25/2016   MCV 91.1 09/25/2016   PLT 253 09/25/2016      Chemistry      Component Value Date/Time   NA 138 09/25/2016 0858   NA 139 03/16/2014 1451   K 4.4 09/25/2016 0858   K 3.7 03/16/2014 1451   CL 104 09/25/2016 0858   CL 101 03/16/2014 1451   CO2 26 09/25/2016 0858   CO2 29 03/16/2014 1451   BUN 16 09/25/2016 0858   BUN 14 03/16/2014 1451   CREATININE 0.74 09/25/2016 0858   CREATININE 1.09 03/16/2014 1451      Component Value Date/Time   CALCIUM 9.2 09/25/2016 0858   CALCIUM 8.9 03/16/2014 1451   ALKPHOS 76 09/25/2016 0858   ALKPHOS 89 03/16/2014 1451   AST 30 09/25/2016 0858   AST 24 03/16/2014 1451   ALT 28 09/25/2016 0858   ALT 36 03/16/2014 1451   BILITOT 0.4  09/25/2016 0858   BILITOT 0.2 03/16/2014 1451       RADIOGRAPHIC STUDIES: I have personally reviewed the radiological images as listed and agreed with the findings in the report. No results found.   ASSESSMENT & PLAN:  Carcinoma of overlapping sites of right breast in female, estrogen receptor negative ([place]) # Stage I BReast ca- triple negative [2010]; clinically no evidence of recurrence. Recent mammogram in January 2018 within normal limits. Discussed the role of CA 2729; also discussed she is likely cured of cancer.  # Genetic testing- Genetic counseling-  we will check myrisk test. Patient meets the NCCN criteria for genetic testing  [age of diagnosis less than 60; given triple negative cancer]. Discussed the potential medical/social [insurance] implications of a positive test. Patient agrees. We will perform genetic testing today.  # Elevated Blood pressure- recommend checking at home. Informed PCP for elevated   # Osteoporosis- BMD- 2.5; add ca 500/ vit D. intolerance to Fosamax. discuss at length regarding re: reclast/ prolia. Discussed regarding exercise. Patient was to hold off prolia/reclast at this time.   # Follow-up with me in one year/labs.  Cc; Arnette Norris.    Orders Placed This Encounter  Procedures  . CBC with Differential/Platelet    Standing Status:   Future    Standing Expiration Date:   03/28/2018  . Comprehensive metabolic panel    Standing Status:   Future    Standing Expiration Date:   03/28/2018  . Cancer antigen 27.29    Standing Status:   Future    Standing Expiration Date:   03/28/2018   All questions were answered. The patient knows to call the clinic with  any problems, questions or concerns.      Cammie Sickle, MD 09/25/2016 1:24 PM

## 2016-09-25 NOTE — Assessment & Plan Note (Addendum)
#   Stage I BReast ca- triple negative [2010]; clinically no evidence of recurrence. Recent mammogram in January 2018 within normal limits. Discussed the role of CA 2729; also discussed she is likely cured of cancer.  # Genetic testing- Genetic counseling-  we will check myrisk test. Patient meets the NCCN criteria for genetic testing  [age of diagnosis less than 60; given triple negative cancer]. Discussed the potential medical/social [insurance] implications of a positive test. Patient agrees. We will perform genetic testing today.  # Elevated Blood pressure- recommend checking at home. Informed PCP for elevated   # Osteoporosis- BMD- 2.5; add ca 500/ vit D. intolerance to Fosamax. discuss at length regarding re: reclast/ prolia. Discussed regarding exercise. Patient was to hold off prolia/reclast at this time.   # Follow-up with me in one year/labs.  Cc; Anna Randolph.

## 2016-10-07 ENCOUNTER — Other Ambulatory Visit: Payer: Self-pay | Admitting: Family Medicine

## 2016-10-09 NOTE — Telephone Encounter (Signed)
Last refill 03/16/16  Last OV 05/12/16 Ok to refill?

## 2016-10-12 ENCOUNTER — Telehealth: Payer: Self-pay | Admitting: Internal Medicine

## 2016-10-12 NOTE — Telephone Encounter (Signed)
Left vm for patient -requesting a return phone call. 

## 2016-10-12 NOTE — Telephone Encounter (Signed)
Patient returned RNs phone call. Results discussed with patient. Pt requested her copy/folder with the genetic results to be mailed to her address. I verified her address with patient.

## 2016-10-12 NOTE — Telephone Encounter (Signed)
Please inform patient that- negative for for any genetic abnormalities that could've caused her breast cancer. Thx

## 2016-10-23 ENCOUNTER — Other Ambulatory Visit: Payer: Self-pay

## 2016-10-23 ENCOUNTER — Encounter: Payer: Self-pay | Admitting: Family Medicine

## 2016-10-23 MED ORDER — ROSUVASTATIN CALCIUM 10 MG PO TABS: 10.0000 mg | ORAL_TABLET | Freq: Every day | ORAL | 1 refills | Status: DC

## 2016-10-30 ENCOUNTER — Encounter: Payer: Self-pay | Admitting: Internal Medicine

## 2016-11-03 ENCOUNTER — Encounter: Payer: Self-pay | Admitting: Family Medicine

## 2016-11-03 ENCOUNTER — Ambulatory Visit (INDEPENDENT_AMBULATORY_CARE_PROVIDER_SITE_OTHER): Payer: PPO | Admitting: Family Medicine

## 2016-11-03 DIAGNOSIS — M25531 Pain in right wrist: Secondary | ICD-10-CM

## 2016-11-03 MED ORDER — MELOXICAM 15 MG PO TABS: 15.0000 mg | ORAL_TABLET | Freq: Every day | ORAL | 0 refills | Status: DC

## 2016-11-03 NOTE — Assessment & Plan Note (Signed)
Likely carpal tunnel but OA also possible.  Not clearly tendonitis.  Treat with bedtime carpal tunnel brace and NSAIDs.  Follow up if not improving.

## 2016-11-03 NOTE — Progress Notes (Signed)
   Subjective:    Patient ID: Anna Randolph, female    DOB: 1950/11/12, 66 y.o.   MRN: [id number]  HPI   66 year old female pt of Dr. Hulen Shouts presents with new onset wrist and thumb pain.   No known falls, or injury.   She reports radial wrist pain in last few months.  Pain is off and on. No radiation of pain.  No  numbness, no tingling in hand. No weakness.  No redness, no swelling.   She has treated with essential oils.. Nothing else.  Works part time on [employer]. She has been going through closets lately.   No history of OA, carpal tunnel etc.  Blood pressure 138/80, pulse (!) 101, temperature 98.5 F (36.9 C), temperature source Oral, height 5\' 4"  (1.626 m), weight 163 lb 4 oz (74 kg).  Review of Systems  Constitutional: Negative for fatigue and fever.  HENT: Negative for ear pain.   Eyes: Negative for pain.  Respiratory: Negative for chest tightness and shortness of breath.   Cardiovascular: Negative for chest pain, palpitations and leg swelling.  Gastrointestinal: Negative for abdominal pain.  Genitourinary: Negative for dysuria.       Objective:   Physical Exam  Constitutional: Vital signs are normal. She appears well-developed and well-nourished. She is cooperative.  Non-toxic appearance. She does not appear ill. No distress.  HENT:  Head: Normocephalic.  Right Ear: Hearing, tympanic membrane, external ear and ear canal normal. Tympanic membrane is not erythematous, not retracted and not bulging.  Left Ear: Hearing, tympanic membrane, external ear and ear canal normal. Tympanic membrane is not erythematous, not retracted and not bulging.  Nose: No mucosal edema or rhinorrhea. Right sinus exhibits no maxillary sinus tenderness and no frontal sinus tenderness. Left sinus exhibits no maxillary sinus tenderness and no frontal sinus tenderness.  Mouth/Throat: Uvula is midline, oropharynx is clear and moist and mucous membranes are normal.  Eyes: Pupils are equal, round,  and reactive to light. Conjunctivae, EOM and lids are normal. Lids are everted and swept, no foreign bodies found.  Neck: Trachea normal and normal range of motion. Neck supple. Carotid bruit is not present. No thyroid mass and no thyromegaly present.  Cardiovascular: Normal rate, regular rhythm, S1 normal, S2 normal, normal heart sounds, intact distal pulses and normal pulses.  Exam reveals no gallop and no friction rub.   No murmur heard. Pulmonary/Chest: Effort normal and breath sounds normal. No tachypnea. No respiratory distress. She has no decreased breath sounds. She has no wheezes. She has no rhonchi. She has no rales.  Abdominal: Soft. Normal appearance and bowel sounds are normal. There is no tenderness.  Musculoskeletal:       Right wrist: She exhibits tenderness and bony tenderness. She exhibits normal range of motion, no swelling, no effusion and no crepitus.       Left wrist: Normal.  Positive tinel's , neg phalen's Pain in lateral aspect of palmar wrist over carpal tunnel and radial head.  Neurological: She is alert.  Skin: Skin is warm, dry and intact. No rash noted.  Psychiatric: Her speech is normal and behavior is normal. Judgment and thought content normal. Her mood appears not anxious. Cognition and memory are normal. She does not exhibit a depressed mood.          Assessment & Plan:

## 2016-11-03 NOTE — Patient Instructions (Signed)
Wear carpal tunnel brace at bed only x 2-4 weeks.  Take meloxicam daily for pain and inflammation x 1-2 weeks, then as needed.

## 2016-11-30 ENCOUNTER — Other Ambulatory Visit: Payer: Self-pay | Admitting: Family Medicine

## 2016-11-30 NOTE — Telephone Encounter (Signed)
Last office visit 11/03/2016 with Dr. Diona Browner for right wrist pain.  Last refilled 11/03/2016 for #30 with no refills by Dr. Diona Browner.  Ok to refill?

## 2017-01-01 ENCOUNTER — Encounter: Payer: Self-pay | Admitting: Family Medicine

## 2017-01-22 ENCOUNTER — Encounter: Payer: Self-pay | Admitting: Family Medicine

## 2017-01-22 MED ORDER — ROSUVASTATIN CALCIUM 10 MG PO TABS: 10.0000 mg | ORAL_TABLET | Freq: Every day | ORAL | 0 refills | Status: AC

## 2017-04-16 ENCOUNTER — Ambulatory Visit: Payer: PPO

## 2017-04-23 ENCOUNTER — Encounter: Payer: PPO | Admitting: Family Medicine

## 2017-05-09 ENCOUNTER — Other Ambulatory Visit: Payer: Self-pay | Admitting: Family Medicine

## 2017-09-26 ENCOUNTER — Ambulatory Visit: Payer: PPO | Admitting: Internal Medicine

## 2017-09-26 ENCOUNTER — Other Ambulatory Visit: Payer: PPO

## 2017-11-05 ENCOUNTER — Other Ambulatory Visit: Payer: Self-pay | Admitting: Family Medicine
